# Patient Record
Sex: Female | Born: 1955 | Race: White | Hispanic: No | Marital: Married | Smoking: Former smoker
Health system: Southern US, Community
[De-identification: ages and names within clinical notes are randomized; demographics above are authoritative.]

## PROBLEM LIST (undated history)

## (undated) DIAGNOSIS — C801 Malignant (primary) neoplasm, unspecified: Secondary | ICD-10-CM

## (undated) DIAGNOSIS — I10 Essential (primary) hypertension: Secondary | ICD-10-CM

## (undated) HISTORY — PX: HIP SURGERY: SHX245

## (undated) HISTORY — DX: Malignant (primary) neoplasm, unspecified: C80.1

## (undated) HISTORY — DX: Essential (primary) hypertension: I10

## (undated) HISTORY — PX: TONSILLECTOMY: SUR1361

---

## 2016-05-21 DIAGNOSIS — M810 Age-related osteoporosis without current pathological fracture: Secondary | ICD-10-CM | POA: Diagnosis present

## 2018-08-15 ENCOUNTER — Ambulatory Visit (INDEPENDENT_AMBULATORY_CARE_PROVIDER_SITE_OTHER): Payer: 59 | Admitting: Family Medicine

## 2018-08-15 ENCOUNTER — Encounter: Payer: Self-pay | Admitting: Family Medicine

## 2018-08-15 VITALS — BP 118/68 | HR 88 | Temp 98.4°F | Ht 68.0 in | Wt 143.0 lb

## 2018-08-15 DIAGNOSIS — M810 Age-related osteoporosis without current pathological fracture: Secondary | ICD-10-CM | POA: Diagnosis not present

## 2018-08-15 DIAGNOSIS — Z7689 Persons encountering health services in other specified circumstances: Secondary | ICD-10-CM | POA: Diagnosis not present

## 2018-08-15 DIAGNOSIS — J302 Other seasonal allergic rhinitis: Secondary | ICD-10-CM | POA: Diagnosis not present

## 2018-08-15 DIAGNOSIS — I10 Essential (primary) hypertension: Secondary | ICD-10-CM

## 2018-08-15 MED ORDER — LISINOPRIL-HYDROCHLOROTHIAZIDE 10-12.5 MG PO TABS: 1.0000 | ORAL_TABLET | Freq: Every day | ORAL | 3 refills | Status: DC

## 2018-08-15 NOTE — Patient Instructions (Signed)
Allergies, Adult An allergy is when your body's defense system (immune system) overreacts to an otherwise harmless substance (allergen) that you breathe in or eat or something that touches your skin. When you come into contact with something that you are allergic to, your immune system produces certain proteins (antibodies). These proteins cause cells to release chemicals (histamines) that trigger the symptoms of an allergic reaction. Allergies often affect the nasal passages (allergic rhinitis), eyes (allergic conjunctivitis), skin (atopic dermatitis), and stomach. Allergies can be mild or severe. Allergies cannot spread from person to person (are not contagious). They can develop at any age and may be outgrown. What increases the risk? You may be at greater risk of allergies if other people in your family have allergies. What are the signs or symptoms? Symptoms depend on what type of allergy you have. They may include:  Runny, stuffy nose.  Sneezing.  Itchy mouth, ears, or throat.  Postnasal drip.  Sore throat.  Itchy, red, watery, or puffy eyes.  Skin rash or hives.  Stomach pain.  Vomiting.  Diarrhea.  Bloating.  Wheezing or coughing.  People with a severe allergy to food, medicine, or an insect bite may have a life-threatening allergic reaction (anaphylaxis). Symptoms of anaphylaxis include:  Hives.  Itching.  Flushed face.  Swollen lips, tongue, or mouth.  Tight or swollen throat.  Chest pain or tightness in the chest.  Trouble breathing or shortness of breath.  Rapid heartbeat.  Dizziness or fainting.  Vomiting.  Diarrhea.  Pain in the abdomen.  How is this diagnosed? This condition is diagnosed based on:  Your symptoms.  Your family and medical history.  A physical exam.  You may need to see a health care provider who specializes in treating allergies (allergist). You may also have tests, including:  Skin tests to see which allergens are  causing your symptoms, such as: ? Skin prick test. In this test, your skin is pricked with a tiny needle and exposed to small amounts of possible allergens to see if your skin reacts. ? Intradermal skin test. In this test, a small amount of allergen is injected under your skin to see if your skin reacts. ? Patch test. In this test, a small amount of allergen is placed on your skin and then your skin is covered with a bandage. Your health care provider will check your skin after a couple of days to see if a rash has developed.  Blood tests.  Challenges tests. In this test, you inhale a small amount of allergen by mouth to see if you have an allergic reaction.  You may also be asked to:  Keep a food diary. A food diary is a record of all the foods and drinks you have in a day and any symptoms you experience.  Practice an elimination diet. An elimination diet involves eliminating specific foods from your diet and then adding them back in one by one to find out if a certain food causes an allergic reaction.  How is this treated? Treatment for allergies depends on your symptoms. Treatment may include:  Cold compresses to soothe itching and swelling.  Eye drops.  Nasal sprays.  Using a saline spray or container (neti pot) to flush out the nose (nasal irrigation). These methods can help clear away mucus and keep the nasal passages moist.  Using a humidifier.  Oral antihistamines or other medicines to block allergic reaction and inflammation.  Skin creams to treat rashes or itching.  Diet changes to   eliminate food allergy triggers.  Repeated exposure to tiny amounts of allergens to build up a tolerance and prevent future allergic reactions (immunotherapy). These include: ? Allergy shots. ? Oral treatment. This involves taking small doses of an allergen under the tongue (sublingual immunotherapy).  Emergency epinephrine injection (auto-injector) in case of an allergic emergency. This is  a self-injectable, pre-measured medicine that must be given within the first few minutes of a serious allergic reaction.  Follow these instructions at home:  Avoid known allergens whenever possible.  If you suffer from airborne allergens, wash out your nose daily. You can do this with a saline spray or a neti pot to flush out your nose (nasal irrigation).  Take over-the-counter and prescription medicines only as told by your health care provider.  Keep all follow-up visits as told by your health care provider. This is important.  If you are at risk of a severe allergic reaction (anaphylaxis), keep your auto-injector with you at all times.  If you have ever had anaphylaxis, wear a medical alert bracelet or necklace that states you have a severe allergy. Contact a health care provider if:  Your symptoms do not improve with treatment. Get help right away if:  You have symptoms of anaphylaxis, such as: ? Swollen mouth, tongue, or throat. ? Pain or tightness in your chest. ? Trouble breathing or shortness of breath. ? Dizziness or fainting. ? Severe abdominal pain, vomiting, or diarrhea. This information is not intended to replace advice given to you by your health care provider. Make sure you discuss any questions you have with your health care provider. Document Released: 02/12/2003 Document Revised: 03/20/2017 Document Reviewed: 06/06/2016 Elsevier Interactive Patient Education  2018 [employer].  Managing Your Hypertension Hypertension is commonly called high blood pressure. This is when the force of your blood pressing against the walls of your arteries is too strong. Arteries are blood vessels that carry blood from your heart throughout your body. Hypertension forces the heart to work harder to pump blood, and may cause the arteries to become narrow or stiff. Having untreated or uncontrolled hypertension can cause heart attack, stroke, kidney disease, and other problems. What are  blood pressure readings? A blood pressure reading consists of a higher number over a lower number. Ideally, your blood pressure should be below 120/80. The first ("top") number is called the systolic pressure. It is a measure of the pressure in your arteries as your heart beats. The second ("bottom") number is called the diastolic pressure. It is a measure of the pressure in your arteries as the heart relaxes. What does my blood pressure reading mean? Blood pressure is classified into four stages. Based on your blood pressure reading, your health care provider may use the following stages to determine what type of treatment you need, if any. Systolic pressure and diastolic pressure are measured in a unit called mm Hg. Normal  Systolic pressure: below 737.  Diastolic pressure: below 80. Elevated  Systolic pressure: 106-269.  Diastolic pressure: below 80. Hypertension stage 1  Systolic pressure: 485-462.  Diastolic pressure: 70-35. Hypertension stage 2  Systolic pressure: 009 or above.  Diastolic pressure: 90 or above. What health risks are associated with hypertension? Managing your hypertension is an important responsibility. Uncontrolled hypertension can lead to:  A heart attack.  A stroke.  A weakened blood vessel (aneurysm).  Heart failure.  Kidney damage.  Eye damage.  Metabolic syndrome.  Memory and concentration problems.  What changes can I make to manage my  hypertension? Hypertension can be managed by making lifestyle changes and possibly by taking medicines. Your health care provider will help you make a plan to bring your blood pressure within a normal range. Eating and drinking  Eat a diet that is high in fiber and potassium, and low in salt (sodium), added sugar, and fat. An example eating plan is called the DASH (Dietary Approaches to Stop Hypertension) diet. To eat this way: ? Eat plenty of fresh fruits and vegetables. Try to fill half of your plate at  each meal with fruits and vegetables. ? Eat whole grains, such as whole wheat pasta, brown rice, or whole grain bread. Fill about one quarter of your plate with whole grains. ? Eat low-fat diary products. ? Avoid fatty cuts of meat, processed or cured meats, and poultry with skin. Fill about one quarter of your plate with lean proteins such as fish, chicken without skin, beans, eggs, and tofu. ? Avoid premade and processed foods. These tend to be higher in sodium, added sugar, and fat.  Reduce your daily sodium intake. Most people with hypertension should eat less than 1,500 mg of sodium a day.  Limit alcohol intake to no more than 1 drink a day for nonpregnant women and 2 drinks a day for men. One drink equals 12 oz of beer, 5 oz of wine, or 1 oz of hard liquor. Lifestyle  Work with your health care provider to maintain a healthy body weight, or to lose weight. Ask what an ideal weight is for you.  Get at least 30 minutes of exercise that causes your heart to beat faster (aerobic exercise) most days of the week. Activities may include walking, swimming, or biking.  Include exercise to strengthen your muscles (resistance exercise), such as weight lifting, as part of your weekly exercise routine. Try to do these types of exercises for 30 minutes at least 3 days a week.  Do not use any products that contain nicotine or tobacco, such as cigarettes and e-cigarettes. If you need help quitting, ask your health care provider.  Control any long-term (chronic) conditions you have, such as high cholesterol or diabetes. Monitoring  Monitor your blood pressure at home as told by your health care provider. Your personal target blood pressure may vary depending on your medical conditions, your age, and other factors.  Have your blood pressure checked regularly, as often as told by your health care provider. Working with your health care provider  Review all the medicines you take with your health care  provider because there may be side effects or interactions.  Talk with your health care provider about your diet, exercise habits, and other lifestyle factors that may be contributing to hypertension.  Visit your health care provider regularly. Your health care provider can help you create and adjust your plan for managing hypertension. Will I need medicine to control my blood pressure? Your health care provider may prescribe medicine if lifestyle changes are not enough to get your blood pressure under control, and if:  Your systolic blood pressure is 130 or higher.  Your diastolic blood pressure is 80 or higher.  Take medicines only as told by your health care provider. Follow the directions carefully. Blood pressure medicines must be taken as prescribed. The medicine does not work as well when you skip doses. Skipping doses also puts you at risk for problems. Contact a health care provider if:  You think you are having a reaction to medicines you have taken.  You  have repeated (recurrent) headaches.  You feel dizzy.  You have swelling in your ankles.  You have trouble with your vision. Get help right away if:  You develop a severe headache or confusion.  You have unusual weakness or numbness, or you feel faint.  You have severe pain in your chest or abdomen.  You vomit repeatedly.  You have trouble breathing. Summary  Hypertension is when the force of blood pumping through your arteries is too strong. If this condition is not controlled, it may put you at risk for serious complications.  Your personal target blood pressure may vary depending on your medical conditions, your age, and other factors. For most people, a normal blood pressure is less than 120/80.  Hypertension is managed by lifestyle changes, medicines, or both. Lifestyle changes include weight loss, eating a healthy, low-sodium diet, exercising more, and limiting alcohol. This information is not intended to  replace advice given to you by your health care provider. Make sure you discuss any questions you have with your health care provider. Document Released: 08/13/2012 Document Revised: 10/17/2016 Document Reviewed: 10/17/2016 Elsevier Interactive Patient Education  2018 [employer].  Osteoporosis Osteoporosis is the thinning and loss of density in the bones. Osteoporosis makes the bones more brittle, fragile, and likely to break (fracture). Over time, osteoporosis can cause the bones to become so weak that they fracture after a simple fall. The bones most likely to fracture are the bones in the hip, wrist, and spine. What are the causes? The exact cause is not known. What increases the risk? Anyone can develop osteoporosis. You may be at greater risk if you have a family history of the condition or have poor nutrition. You may also have a higher risk if you are:  Female.  33 years old or older.  A smoker.  Not physically active.  White or Asian.  Slender.  What are the signs or symptoms? A fracture might be the first sign of the disease, especially if it results from a fall or injury that would not usually cause a bone to break. Other signs and symptoms include:  Low back and neck pain.  Stooped posture.  Height loss.  How is this diagnosed? To make a diagnosis, your health care provider may:  Take a medical history.  Perform a physical exam.  Order tests, such as: ? A bone mineral density test. ? A dual-energy X-ray absorptiometry test.  How is this treated? The goal of osteoporosis treatment is to strengthen your bones to reduce your risk of a fracture. Treatment may involve:  Making lifestyle changes, such as: ? Eating a diet rich in calcium. ? Doing weight-bearing and muscle-strengthening exercises. ? Stopping tobacco use. ? Limiting alcohol intake.  Taking medicine to slow the process of bone loss or to increase bone density.  Monitoring your levels of  calcium and vitamin D.  Follow these instructions at home:  Include calcium and vitamin D in your diet. Calcium is important for bone health, and vitamin D helps the body absorb calcium.  Perform weight-bearing and muscle-strengthening exercises as directed by your health care provider.  Do not use any tobacco products, including cigarettes, chewing tobacco, and electronic cigarettes. If you need help quitting, ask your health care provider.  Limit your alcohol intake.  Take medicines only as directed by your health care provider.  Keep all follow-up visits as directed by your health care provider. This is important.  Take precautions at home to lower your risk of  falling, such as: ? Keeping rooms well lit and clutter free. ? Installing safety rails on stairs. ? Using rubber mats in the bathroom and other areas that are often wet or slippery. Get help right away if: You fall or injure yourself. This information is not intended to replace advice given to you by your health care provider. Make sure you discuss any questions you have with your health care provider. Document Released: 08/29/2005 Document Revised: 04/23/2016 Document Reviewed: 04/29/2014 Elsevier Interactive Patient Education  [employer].

## 2018-08-15 NOTE — Progress Notes (Signed)
Patient presents to clinic today to establish care.  SUBJECTIVE: PMH: Pt is a 62 yo female with pmh sig for osteoporosis, HTN, h/o basal cell carcinoma.  Pt previously seen at [facility] in [place], [place].  HTN: -taking lisinopril-HCTZ 10-12.5 mg daily -checks bp at home.  Has not done so since moving as bp cuff still packed. -denies HAs, CP, SOB, blurred vision  H/o basal cell carcinoma: -area on L side of nose, removed -was seeing Derm for yrly skin checks.  Osteoporosis: -thought 2/2 early menopause -was on Fosamax x 5 years.  Not sure made a difference -Pt walks 3 times per week gentleman occasionally. -Taking calcium and vitamin D supplement daily.  Also taking MVI -Last bone scan 2017  Allergies: NKDA  Past surgical history: Tonsillectomy 1963  Social history: Patient is married.  She and her husband (also seen by this provider) moved from the [facility] area after being laid off from their jobs.  Pt is now retired.  Pt has a 68 year old daughter who is a Equities trader at [employer].  Pt used an egg donor to become pregnant.  Pt endorses social alcohol use.  Pt had a brief smoking history starting in late teens, smoked x7 years, 1 pack/day.  Pt denies drug use  Family medical history: Mom-alive, Paget's disease of the nipple status post mastectomy Dad-deceased MI at age 69, heart disease Sister-Allison  Health Maintenance: Immunizations --shingles vaccine 2017, tetanus 2015, influenza 2018 Colonoscopy --2017 Mammogram --2017 PAP --2017 Bone Density --2017   Past Medical History:  Diagnosis Date  . Cancer ([place])   . Hypertension    Current Outpatient Medications on File Prior to Visit  Medication Sig Dispense Refill  . Calcium Carb-Cholecalciferol (CALCIUM 600-D PO) Take 600 mg by mouth daily.    . Multiple Vitamin (MULTIVITAMINS PO) Take by mouth.     No current facility-administered medications on file prior to visit.     No Known Allergies  No family history  on file.  Social History   Socioeconomic History  . Marital status: Married    Spouse name: Not on file  . Number of children: Not on file  . Years of education: Not on file  . Highest education level: Not on file  Occupational History  . Not on file  Social Needs  . Financial resource strain: Not on file  . Food insecurity:    Worry: Not on file    Inability: Not on file  . Transportation needs:    Medical: Not on file    Non-medical: Not on file  Tobacco Use  . Smoking status: Former Research scientist (life sciences)  . Smokeless tobacco: Never Used  Substance and Sexual Activity  . Alcohol use: Yes  . Drug use: Never  . Sexual activity: Not on file  Lifestyle  . Physical activity:    Days per week: Not on file    Minutes per session: Not on file  . Stress: Not on file  Relationships  . Social connections:    Talks on phone: Not on file    Gets together: Not on file    Attends religious service: Not on file    Active member of club or organization: Not on file    Attends meetings of clubs or organizations: Not on file    Relationship status: Not on file  . Intimate partner violence:    Fear of current or ex partner: Not on file    Emotionally abused: Not on file  Physically abused: Not on file    Forced sexual activity: Not on file  Other Topics Concern  . Not on file  Social History Narrative  . Not on file    ROS General: Denies fever, chills, night sweats, changes in weight, changes in appetite HEENT: Denies headaches, ear pain, changes in vision, rhinorrhea, sore throat CV: Denies CP, palpitations, SOB, orthopnea Pulm: Denies SOB, cough, wheezing GI: Denies abdominal pain, nausea, vomiting, diarrhea, constipation GU: Denies dysuria, hematuria, frequency, vaginal discharge Msk: Denies muscle cramps, joint pains Neuro: Denies weakness, numbness, tingling Skin: Denies rashes, bruising Psych: Denies depression, anxiety, hallucinations  BP 118/68 (BP Location: Left Arm, Patient  Position: Sitting, Cuff Size: Normal)   Pulse 88   Temp 98.4 F (36.9 C) (Oral)   Ht 5\' 8"  (1.727 m)   Wt 143 lb (64.9 kg)   SpO2 97%   BMI 21.74 kg/m   Physical Exam Gen. Pleasant, well developed, well-nourished, in NAD HEENT - [place]/AT, PERRL, no scleral icterus, no nasal drainage, pharynx without erythema or exudate.  TMs normal bilaterally.  No cervical lymphadenopathy. Lungs: no use of accessory muscles, CTAB, no wheezes, rales or rhonchi Cardiovascular: RRR, No r/g/m, no peripheral edema Abdomen: BS present, soft, nontender, nondistended Neuro:  A&Ox3, CN II-XII intact, normal gait Skin:  Warm, dry, intact, no lesions  No results found for this or any previous visit (from the past 2160 hour(s)).  Assessment/Plan: Essential hypertension  -controlled -Continue lifestyle modifications -Given handout - Plan: lisinopril-hydrochlorothiazide (PRINZIDE,ZESTORETIC) 10-12.5 MG tablet  Seasonal allergies -Given handout -OTC Claritin on Zyrtec as needed.  Also consider nasal rinse  Age-related osteoporosis without current pathological fracture -Continue vitamin D and calcium supplements -Continue weightbearing exercise -Consider repeat bone scan  Encounter to establish care -We reviewed the PMH, PSH, FH, SH, Meds and Allergies. -We provided refills for any medications we will prescribe as needed. -We addressed current concerns per orders and patient instructions. -We have asked for records for pertinent exams, studies, vaccines and notes from previous providers. -We have advised patient to follow up per instructions below.  Follow-up in the next few months, sooner if needed  Grier Mitts, MD

## 2018-08-20 ENCOUNTER — Encounter: Payer: Self-pay | Admitting: Family Medicine

## 2018-09-18 ENCOUNTER — Ambulatory Visit (INDEPENDENT_AMBULATORY_CARE_PROVIDER_SITE_OTHER): Payer: BLUE CROSS/BLUE SHIELD | Admitting: *Deleted

## 2018-09-18 DIAGNOSIS — Z23 Encounter for immunization: Secondary | ICD-10-CM

## 2019-06-29 DIAGNOSIS — M545 Low back pain: Secondary | ICD-10-CM | POA: Diagnosis not present

## 2019-06-29 DIAGNOSIS — M25552 Pain in left hip: Secondary | ICD-10-CM | POA: Diagnosis not present

## 2019-07-08 DIAGNOSIS — M545 Low back pain: Secondary | ICD-10-CM | POA: Diagnosis not present

## 2019-07-08 DIAGNOSIS — M25552 Pain in left hip: Secondary | ICD-10-CM | POA: Diagnosis not present

## 2019-07-22 DIAGNOSIS — M545 Low back pain: Secondary | ICD-10-CM | POA: Diagnosis not present

## 2019-07-22 DIAGNOSIS — M25552 Pain in left hip: Secondary | ICD-10-CM | POA: Diagnosis not present

## 2019-08-05 ENCOUNTER — Other Ambulatory Visit: Payer: Self-pay | Admitting: Family Medicine

## 2019-08-05 ENCOUNTER — Telehealth: Payer: Self-pay

## 2019-08-05 DIAGNOSIS — M25552 Pain in left hip: Secondary | ICD-10-CM | POA: Diagnosis not present

## 2019-08-05 DIAGNOSIS — M545 Low back pain: Secondary | ICD-10-CM | POA: Diagnosis not present

## 2019-08-05 DIAGNOSIS — I10 Essential (primary) hypertension: Secondary | ICD-10-CM

## 2019-08-05 MED ORDER — LISINOPRIL-HYDROCHLOROTHIAZIDE 10-12.5 MG PO TABS: 1.0000 | ORAL_TABLET | Freq: Every day | ORAL | 0 refills | Status: DC

## 2019-08-05 NOTE — Telephone Encounter (Signed)
Pt needs appointment for further refills 

## 2019-08-05 NOTE — Telephone Encounter (Signed)
Requested medication (s) are due for refill today: yes  Requested medication (s) are on the active medication list: yes  Last refill:  08/2018  Future visit scheduled: no  Notes to clinic: review for refill  Requested Prescriptions  Pending Prescriptions Disp Refills   lisinopril-hydrochlorothiazide (ZESTORETIC) 10-12.5 MG tablet 90 tablet 3    Sig: Take 1 tablet by mouth daily.     Cardiovascular:  ACEI + Diuretic Combos Failed - 08/05/2019  1:11 PM      Failed - Na in normal range and within 180 days    No results found for: NA       Failed - K in normal range and within 180 days    No results found for: K, POTASSIUM       Failed - Cr in normal range and within 180 days    No results found for: CREATININE       Failed - Ca in normal range and within 180 days    No results found for: CALCIUM, CORRECTEDCA, CAWHOLEBLD, POCCA       Failed - Valid encounter within last 6 months    Recent Outpatient Visits          11 months ago Essential hypertension   Therapist, music at [place], [place]             Passed - Patient is not pregnant      Passed - Last BP in normal range    BP Readings from Last 1 Encounters:  08/15/18 118/68

## 2019-08-05 NOTE — Telephone Encounter (Signed)
Medication Refill - Medication: lisinopril-hydrochlorothiazide (PRINZIDE,ZESTORETIC) 10-12.5 MG tablet    Preferred Pharmacy (with phone number or street name):  [id number] - [place], [place]. AT [address] [phone] (Phone) [phone] (Fax)

## 2019-08-05 NOTE — Telephone Encounter (Signed)
Copied from [place] [id number]. Topic: General - Other >> Aug 05, 2019  1:10 PM Keene Breath wrote: Reason for CRM: Patient called to ask about the shingrix vaccine.  Would like for the nurse to call her back to discuss.  CB# [phone]

## 2019-08-07 NOTE — Telephone Encounter (Signed)
LVM for pt with information regarding shingrix vaccine

## 2019-08-12 ENCOUNTER — Telehealth: Payer: Self-pay

## 2019-08-12 NOTE — Telephone Encounter (Signed)
Copied from [place] [id number]. Topic: General - Other >> Aug 05, 2019  1:10 PM Keene Breath wrote: Reason for CRM: Patient called to ask about the shingrix vaccine.  Would like for the nurse to call her back to discuss.  CB# [phone] >> Aug 12, 2019 12:24 PM Rainey Pines A wrote: Patient called back in regards to getting approval for shingrix  shot and would like a callback

## 2019-08-17 NOTE — Telephone Encounter (Signed)
Pt is scheduled for a shingle vaccine on 08/25/2019 at 2.15 pm, pt state that pharmacy did not receive refills on her Lisinopril-hctz, verbal order given for 90 days with 3 refills

## 2019-08-25 ENCOUNTER — Other Ambulatory Visit: Payer: Self-pay

## 2019-08-25 ENCOUNTER — Ambulatory Visit (INDEPENDENT_AMBULATORY_CARE_PROVIDER_SITE_OTHER): Payer: BC Managed Care – PPO

## 2019-08-25 DIAGNOSIS — Z23 Encounter for immunization: Secondary | ICD-10-CM | POA: Diagnosis not present

## 2019-09-09 ENCOUNTER — Ambulatory Visit (INDEPENDENT_AMBULATORY_CARE_PROVIDER_SITE_OTHER): Payer: BC Managed Care – PPO

## 2019-09-09 ENCOUNTER — Other Ambulatory Visit: Payer: Self-pay

## 2019-09-09 DIAGNOSIS — Z23 Encounter for immunization: Secondary | ICD-10-CM

## 2019-09-09 NOTE — Progress Notes (Signed)
Per orders of Dr. Banks, injection of Shingrix given by Landi Biscardi. Patient tolerated injection well.  

## 2019-11-16 ENCOUNTER — Encounter: Payer: Self-pay | Admitting: Family Medicine

## 2019-11-18 ENCOUNTER — Telehealth (INDEPENDENT_AMBULATORY_CARE_PROVIDER_SITE_OTHER): Payer: Self-pay | Admitting: Family Medicine

## 2019-11-18 DIAGNOSIS — M5382 Other specified dorsopathies, cervical region: Secondary | ICD-10-CM

## 2019-11-18 MED ORDER — CYCLOBENZAPRINE HCL 5 MG PO TABS: 5.0000 mg | ORAL_TABLET | Freq: Three times a day (TID) | ORAL | 1 refills | Status: DC | PRN

## 2019-11-18 NOTE — Progress Notes (Signed)
Virtual Visit via Telephone Note Pt called on phone after connectivity issues with doxy.  I connected with Roberts Gaudy on 11/18/19 at  4:00 PM EST by telephone and verified that I am speaking with the correct person using two identifiers.   I discussed the limitations, risks, security and privacy concerns of performing an evaluation and management service by telephone and the availability of in person appointments. I also discussed with the patient that there may be a patient responsible charge related to this service. The patient expressed understanding and agreed to proceed.  Location patient: home Location provider: work or home office Participants present for the call: patient, provider Patient did not have a visit in the prior 7 days to address this/these issue(s).   History of Present Illness: Pt is a 63 yo female with pmh sig for HTN, h/o basal cell carcinoma s/p removal, osteoporosis.  Pt with pain in L posterior head/neck at mastoid process off and on x several wks.  Pt did not have symptoms while on 5 d vacation in [place].  Upon return was symptom free x 3 days before symptoms returned.  Aleeve, tylenol, advil do not help.  Had vision checked, notes rx changed, awaiting new glasses.  Pt got a new bed in Oct.  Pt denies increased stress.  Pt retired from [employer].  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  Reads the news on her ipad in am.  Drinks 2 cups of coffee per day.  Denies grinding her teeth.  Pt also tried sleeping in a different bed, using a different pillow with no resolve.  Notes had shingles vaccine in Oct.   Observations/Objective: Patient sounds cheerful and well on the phone. I do not appreciate any SOB. Speech and thought processing are grossly intact. Patient reported vitals:  Assessment and Plan:  Musculoskeletal disorder involving sternocleidomastoid  -symptoms likely 2/2 muscle tension in L sternocleidomastoid at mastoid process. Also consider  stress as symptoms resolved while on vacation. -discussed supportive care including heat, massage, OTC topical analgesic rubs, stretching -reviewed exercises/stretching - Plan: cyclobenzaprine (FLEXERIL) 5 MG tablet   Follow Up Instructions: F/u in 2-4 wks for continued symptoms.  I did not refer this patient for an OV in the next 24 hours for this/these issue(s).  I discussed the assessment and treatment plan with the patient. The patient was provided an opportunity to ask questions and all were answered. The patient agreed with the plan and demonstrated an understanding of the instructions.   The patient was advised to call back or seek an in-person evaluation if the symptoms worsen or if the condition fails to improve as anticipated.  I provided 13 minutes of non-face-to-face time during this encounter.   Billie Ruddy, MD   This note is not being shared with the patient for the following reason: To prevent harm (release of this note would result in harm to the life or physical safety of the patient or another).

## 2020-02-11 ENCOUNTER — Other Ambulatory Visit: Payer: Self-pay

## 2020-02-12 ENCOUNTER — Ambulatory Visit (INDEPENDENT_AMBULATORY_CARE_PROVIDER_SITE_OTHER): Payer: BC Managed Care – PPO

## 2020-02-12 DIAGNOSIS — Z23 Encounter for immunization: Secondary | ICD-10-CM

## 2020-02-12 NOTE — Progress Notes (Signed)
Per orders of Dr. Grier Mitts, 2nd Shingles vaccine given by Franco Collet. Patient tolerated injection well.

## 2020-02-12 NOTE — Patient Instructions (Signed)
Health Maintenance Due  Topic Date Due  . Hepatitis C Screening  Never done  . HIV Screening  Never done  . PAP SMEAR-Modifier  Never done  . MAMMOGRAM  Never done    No flowsheet data found.

## 2020-04-19 DIAGNOSIS — M545 Low back pain: Secondary | ICD-10-CM | POA: Diagnosis not present

## 2020-04-19 DIAGNOSIS — M25552 Pain in left hip: Secondary | ICD-10-CM | POA: Diagnosis not present

## 2020-04-27 DIAGNOSIS — M545 Low back pain: Secondary | ICD-10-CM | POA: Diagnosis not present

## 2020-04-27 DIAGNOSIS — M25552 Pain in left hip: Secondary | ICD-10-CM | POA: Diagnosis not present

## 2020-05-24 DIAGNOSIS — M25552 Pain in left hip: Secondary | ICD-10-CM | POA: Diagnosis not present

## 2020-05-24 DIAGNOSIS — M545 Low back pain: Secondary | ICD-10-CM | POA: Diagnosis not present

## 2020-07-08 DIAGNOSIS — M545 Low back pain: Secondary | ICD-10-CM | POA: Diagnosis not present

## 2020-07-08 DIAGNOSIS — M25552 Pain in left hip: Secondary | ICD-10-CM | POA: Diagnosis not present

## 2020-07-26 DIAGNOSIS — M25552 Pain in left hip: Secondary | ICD-10-CM | POA: Diagnosis not present

## 2020-07-26 DIAGNOSIS — M545 Low back pain: Secondary | ICD-10-CM | POA: Diagnosis not present

## 2020-08-15 ENCOUNTER — Other Ambulatory Visit: Payer: Self-pay | Admitting: Family Medicine

## 2020-08-15 DIAGNOSIS — I10 Essential (primary) hypertension: Secondary | ICD-10-CM

## 2020-08-18 ENCOUNTER — Encounter: Payer: Self-pay | Admitting: Family Medicine

## 2020-11-09 ENCOUNTER — Other Ambulatory Visit: Payer: Self-pay | Admitting: Family Medicine

## 2020-11-09 DIAGNOSIS — I10 Essential (primary) hypertension: Secondary | ICD-10-CM

## 2020-11-09 NOTE — Telephone Encounter (Signed)
Pt needs appointment for further refill 

## 2020-12-22 ENCOUNTER — Ambulatory Visit (INDEPENDENT_AMBULATORY_CARE_PROVIDER_SITE_OTHER): Payer: Medicare Other

## 2020-12-22 ENCOUNTER — Ambulatory Visit (INDEPENDENT_AMBULATORY_CARE_PROVIDER_SITE_OTHER): Payer: Medicare Other | Admitting: Family Medicine

## 2020-12-22 ENCOUNTER — Other Ambulatory Visit: Payer: Self-pay

## 2020-12-22 ENCOUNTER — Encounter: Payer: Self-pay | Admitting: Family Medicine

## 2020-12-22 VITALS — BP 142/86 | HR 101 | Ht 68.0 in | Wt 153.0 lb

## 2020-12-22 DIAGNOSIS — M5442 Lumbago with sciatica, left side: Secondary | ICD-10-CM

## 2020-12-22 DIAGNOSIS — G8929 Other chronic pain: Secondary | ICD-10-CM

## 2020-12-22 NOTE — Patient Instructions (Signed)
Xray today Total of 2000IU of Vit D Daily Tart cherry 1200mg  at night Exercises 3x a week Ice 66min 2x a day See me in 6 weeks can discuss gabapentin

## 2020-12-22 NOTE — Progress Notes (Signed)
[facility] [place] Phone: [phone] Subjective:   Sherry Harrison, am serving as a scribe for Dr. Hulan Saas. This visit occurred during the SARS-CoV-2 public health emergency.  Safety protocols were in place, including screening questions prior to the visit, additional usage of staff PPE, and extensive cleaning of exam room while observing appropriate contact time as indicated for disinfecting solutions.   I'm seeing this patient by the request  of:  Billie Ruddy, MD  CC: Low back and hip pain  [id number]  Sherry Harrison is a 65 y.o. female coming in with complaint of back pain for years. Has had a lot of physical therapy. Pain radiates from back to the left hip. Had MRI of left hip in [place]. Was told all pain was soft tissue related. Has been doing physical therapy at [facility] and has occasional dry needling therapy. Pain starts in left lumbar spine that radiates down into the glute that then travels into the groin. When pain is bad her pain will also radiate into the foot. Patient mentions having trouble going up stairs. Has not tried chiropractic care.  Patient states that has started to increase little activity and has been slowly getting better again.  Patient though is concerned that this can come back and worsen again. Past medical history also significant for osteoporosis    Past Medical History:  Diagnosis Date  . Cancer ([place])   . Hypertension    Past Surgical History:  Procedure Laterality Date  . TONSILLECTOMY     Social History   Socioeconomic History  . Marital status: Married    Spouse name: Not on file  . Number of children: Not on file  . Years of education: Not on file  . Highest education level: Not on file  Occupational History  . Not on file  Tobacco Use  . Smoking status: Former Research scientist (life sciences)  . Smokeless tobacco: Never Used  Substance and Sexual Activity  . Alcohol use: Yes  . Drug  use: Never  . Sexual activity: Not on file  Other Topics Concern  . Not on file  Social History Narrative  . Not on file   Social Determinants of Health   Financial Resource Strain: Not on file  Food Insecurity: Not on file  Transportation Needs: Not on file  Physical Activity: Not on file  Stress: Not on file  Social Connections: Not on file   Harrison Known Allergies Harrison family history on file.   Current Outpatient Medications (Cardiovascular):  .  lisinopril-hydrochlorothiazide (ZESTORETIC) 10-12.5 MG tablet, TAKE 1 TABLET DAILY     Current Outpatient Medications (Other):  Marland Kitchen  Calcium Carb-Cholecalciferol (CALCIUM 600-D PO), Take 600 mg by mouth daily. .  cyclobenzaprine (FLEXERIL) 5 MG tablet, Take 1 tablet (5 mg total) by mouth 3 (three) times daily as needed for muscle spasms. .  Multiple Vitamin (MULTIVITAMINS PO), Take by mouth.   Reviewed prior external information including notes and imaging from  primary care provider As well as notes that were available from care everywhere and other healthcare systems.  Past medical history, social, surgical and family history all reviewed in electronic medical record.  Harrison pertanent information unless stated regarding to the chief complaint.   Review of Systems:  Harrison headache, visual changes, nausea, vomiting, diarrhea, constipation, dizziness, abdominal pain, skin rash, fevers, chills, night sweats, weight loss, swollen lymph nodes, body aches, joint swelling, chest pain, shortness of breath, mood changes.  POSITIVE muscle aches  Objective  Blood pressure (!) 142/86, pulse (!) 101, height 5\' 8"  (1.727 m), weight 153 lb (69.4 kg), SpO2 97 %.   General: Harrison apparent distress alert and oriented x3 mood and affect normal, dressed appropriately.  HEENT: Pupils equal, extraocular movements intact  Respiratory: Patient's speak in full sentences and does not appear short of breath  Cardiovascular: Harrison lower extremity edema, non tender, Harrison  erythema  Gait normal with good balance and coordination.  MSK: Low back exam does have some loss of lordosis.  Some tenderness to palpation in the paraspinal musculature left greater than right.  Mild tightness on FABER test on the left.  Patient does have some limited internal range of motion of the left hip noted.  Straight leg test does have some tightness of the hamstring but Harrison true radicular symptoms noted.  Patient does have increasing discomfort with dorsiflexion.  5 out of 5 strength though of the lower extremities and deep tendon reflexes do appear to be intact  [id number]; 15 additional minutes spent for Therapeutic exercises as stated in above notes.  This included exercises focusing on stretching, strengthening, with significant focus on eccentric aspects.   Long term goals include an improvement in range of motion, strength, endurance as well as avoiding reinjury. Patient's frequency would include in 1-2 times a day, 3-5 times a week for a duration of 6-12 weeks.  Proper technique shown and discussed handout in great detail with ATC.  All questions were discussed and answered.      Impression and Recommendations:     The above documentation has been reviewed and is accurate and complete Lyndal Pulley, DO

## 2020-12-23 ENCOUNTER — Encounter: Payer: Self-pay | Admitting: Family Medicine

## 2020-12-23 DIAGNOSIS — M545 Low back pain, unspecified: Secondary | ICD-10-CM | POA: Insufficient documentation

## 2020-12-23 NOTE — Assessment & Plan Note (Signed)
We discussed with patient that I do feel that there is some lumbar radiculopathy.  Differential includes the possibility of piriformis syndrome.  We do not have the MRI from the outside facility but has been told that it was more muscular in nature.  Patient does have a past medical history significant for osteoporosis and will get x-rays to rule out any type of occult fracture.  I think it is highly unlikely.  Patient does have some stiffness noted though.  Patient has Flexeril at this time and we did discuss the potential for gabapentin which patient declined.  We may need to consider that in the future as well as formal physical therapy.  Patient will start with home exercises first and we discussed over-the-counter natural vitamins.  Patient will follow-up with me again in 5 to 6 weeks and discussed once again the possibility of gabapentin and formal physical therapy or even advanced imaging if any weakness is noted

## 2020-12-26 ENCOUNTER — Other Ambulatory Visit: Payer: Self-pay | Admitting: Family Medicine

## 2020-12-26 ENCOUNTER — Encounter: Payer: Self-pay | Admitting: Family Medicine

## 2020-12-26 DIAGNOSIS — Z1231 Encounter for screening mammogram for malignant neoplasm of breast: Secondary | ICD-10-CM

## 2020-12-28 ENCOUNTER — Ambulatory Visit
Admission: RE | Admit: 2020-12-28 | Discharge: 2020-12-28 | Disposition: A | Discharge status: Home / Self Care | Payer: Medicare Other | Source: Ambulatory Visit

## 2020-12-28 ENCOUNTER — Other Ambulatory Visit: Payer: Self-pay

## 2020-12-28 DIAGNOSIS — Z1231 Encounter for screening mammogram for malignant neoplasm of breast: Secondary | ICD-10-CM

## 2020-12-28 LAB — HM MAMMOGRAPHY

## 2021-01-04 ENCOUNTER — Other Ambulatory Visit: Payer: Self-pay

## 2021-01-05 ENCOUNTER — Encounter: Payer: Self-pay | Admitting: Family Medicine

## 2021-01-05 ENCOUNTER — Ambulatory Visit (INDEPENDENT_AMBULATORY_CARE_PROVIDER_SITE_OTHER): Payer: Medicare Other | Admitting: Family Medicine

## 2021-01-05 VITALS — BP 138/78 | HR 83 | Temp 98.2°F | Wt 153.4 lb

## 2021-01-05 DIAGNOSIS — K581 Irritable bowel syndrome with constipation: Secondary | ICD-10-CM

## 2021-01-05 DIAGNOSIS — R030 Elevated blood-pressure reading, without diagnosis of hypertension: Secondary | ICD-10-CM

## 2021-01-05 NOTE — Progress Notes (Signed)
Subjective:    Patient ID: Sherry Harrison, female    DOB: [birth year], 65 y.o.   MRN: [id number]  No chief complaint on file.   HPI Patient is a 65 yo female with pmh sig for chronic low back pain who was seen today for acute concern.  Patient endorses episodes of flatus, nausea, constipation, bloating.  HAs a sensation of food feeling stuck in middle of abdomen at diaphragm.  Cannot correlate specific food for symptoms.  Eating healthy overall (grains, vegetables, grilled fish, chicken, positive, potatoes, rice).  Drinks 2 cups of coffee and water throughout the day.  Constipation when traveling.  A recent lumbar x-ray but noted constipation.  Took MiraLAX and Colace for several days to help with constipation.  Denies blood in stools, emesis, HA, diarrhea, increased stress.  Patient notes history of whitecoat hypertension.  Past Medical History:  Diagnosis Date  . Cancer ([place])   . Hypertension     No Known Allergies  ROS General: Denies fever, chills, night sweats, changes in weight, changes in appetite HEENT: Denies headaches, ear pain, changes in vision, rhinorrhea, sore throat CV: Denies CP, palpitations, SOB, orthopnea Pulm: Denies SOB, cough, wheezing GI: Denies abdominal pain, nausea, vomiting, diarrhea  +constipation, bloating, flatus GU: Denies dysuria, hematuria, frequency, vaginal discharge Msk: Denies muscle cramps, joint pains Neuro: Denies weakness, numbness, tingling Skin: Denies rashes, bruising Psych: Denies depression, anxiety, hallucinations     Objective:    Blood pressure 138/78, pulse 83, temperature 98.2 F (36.8 C), temperature source Oral, weight 153 lb 6.4 oz (69.6 kg), SpO2 98 %.  Gen. Pleasant, well-nourished, in no distress, normal affect  HEENT: [place]/AT, face symmetric, conjunctiva clear, no scleral icterus, PERRLA, EOMI, nares patent without drainage Lungs: no accessory muscle use, CTAB Cardiovascular: RRR, no m/r/g, no peripheral edema Abdomen:  BS present, soft, NT/ND, no hepatosplenomegaly. Musculoskeletal: No deformities, no cyanosis or clubbing, normal tone Neuro:  A&Ox3, CN II-XII intact, normal gait Skin:  Warm, no lesions/ rash  Wt Readings from Last 3 Encounters:  12/22/20 153 lb (69.4 kg)  08/15/18 143 lb (64.9 kg)    No results found for: WBC, HGB, HCT, PLT, GLUCOSE, CHOL, TRIG, HDL, LDLDIRECT, LDLCALC, ALT, AST, NA, K, CL, CREATININE, BUN, CO2, TSH, PSA, INR, GLUF, HGBA1C, MICROALBUR  Assessment/Plan: Irritable bowel syndrome with constipation -discussed supportive care including low FODMAP diet -pt to keep a food diary  -consider probiotic -miralax daily prn -f/u in 4 wks -consider referral to GI  Elevated blood pressure reading without diagnosis of HTN -pt encouraged to obtain bp cuff to monitor at home and keep a log to bring without to next OFV -decrease sodium intake -increase po intake of water  F/u in 1 month  Grier Mitts, MD

## 2021-01-05 NOTE — Patient Instructions (Addendum)
Start keeping a food diary to bring with you to your next appointment.  You can use MiraLAX daily as needed to help with constipation. Irritable Bowel Syndrome, Adult  Irritable bowel syndrome (IBS) is a group of symptoms that affects the organs responsible for digestion (gastrointestinal or GI tract). IBS is not one specific disease. To regulate how the GI tract works, the body sends signals back and forth between the intestines and the brain. If you have IBS, there may be a problem with these signals. As a result, the GI tract does not function normally. The intestines may become more sensitive and overreact to certain things. This may be especially true when you eat certain foods or when you are under stress. There are four types of IBS. These may be determined based on the consistency of your stool (feces):  IBS with diarrhea.  IBS with constipation.  Mixed IBS.  Unsubtyped IBS. It is important to know which type of IBS you have. Certain treatments are more likely to be helpful for certain types of IBS. What are the causes? The exact cause of IBS is not known. What increases the risk? You may have a higher risk for IBS if you:  Are female.  Are younger than 91.  Have a family history of IBS.  Have a mental health condition, such as depression, anxiety, or post-traumatic stress disorder.  Have had a bacterial infection of your GI tract. What are the signs or symptoms? Symptoms of IBS vary from person to person. The main symptom is abdominal pain or discomfort. Other symptoms usually include one or more of the following:  Diarrhea, constipation, or both.  Abdominal swelling or bloating.  Feeling full after eating a small or regular-sized meal.  Frequent gas.  Mucus in the stool.  A feeling of having more stool left after a bowel movement. Symptoms tend to come and go. They may be triggered by stress, mental health conditions, or certain foods. How is this  diagnosed? This condition may be diagnosed based on a physical exam, your medical history, and your symptoms. You may have tests, such as:  Blood tests.  Stool test.  X-rays.  CT scan.  Colonoscopy. This is a procedure in which your GI tract is viewed with a long, thin, flexible tube. How is this treated? There is no cure for IBS, but treatment can help relieve symptoms. Treatment depends on the type of IBS you have, and may include:  Changes to your diet, such as: ? Avoiding foods that cause symptoms. ? Drinking more water. ? Following a low-FODMAP (fermentable oligosaccharides, disaccharides, monosaccharides, and polyols) diet for up to 6 weeks, or as told by your health care provider. FODMAPs are sugars that are hard for some people to digest. ? Eating more fiber. ? Eating medium-sized meals at the same times every day.  Medicines. These may include: ? Fiber supplements, if you have constipation. ? Medicine to control diarrhea (antidiarrheal medicines). ? Medicine to help control muscle tightening (spasms) in your GI tract (antispasmodic medicines). ? Medicines to help with mental health conditions, such as antidepressants or tranquilizers.  Talk therapy or counseling.  Working with a diet and nutrition specialist (dietitian) to help create a food plan that is right for you.  Managing your stress. Follow these instructions at home: Eating and drinking  Eat a healthy diet.  Eat medium-sized meals at about the same time every day. Do not eat large meals.  Gradually eat more fiber-rich foods. These include whole  grains, fruits, and vegetables. This may be especially helpful if you have IBS with constipation.  Eat a diet low in FODMAPs.  Drink enough fluid to keep your urine pale yellow.  Keep a journal of foods that seem to trigger symptoms.  Avoid foods and drinks that: ? Contain added sugar. ? Make your symptoms worse. Dairy products, caffeinated drinks, and  carbonated drinks can make symptoms worse for some people. General instructions  Take over-the-counter and prescription medicines and supplements only as told by your health care provider.  Get enough exercise. Do at least 150 minutes of moderate-intensity exercise each week.  Manage your stress. Getting enough sleep and exercise can help you manage stress.  Keep all follow-up visits as told by your health care provider and therapist. This is important. Alcohol Use  Do not drink alcohol if: ? Your health care provider tells you not to drink. ? You are pregnant, may be pregnant, or are planning to become pregnant.  If you drink alcohol, limit how much you have: ? 0-1 drink a day for women. ? 0-2 drinks a day for men.  Be aware of how much alcohol is in your drink. In the [place] one drink equals one typical bottle of beer (12 oz), one-half glass of wine (5 oz), or one shot of hard liquor (1 oz). Contact a health care provider if you have:  Constant pain.  Weight loss.  Difficulty or pain when swallowing.  Diarrhea that gets worse. Get help right away if you have:  Severe abdominal pain.  Fever.  Diarrhea with symptoms of dehydration, such as dizziness or dry mouth.  Bright red blood in your stool.  Stool that is black and tarry.  Abdominal swelling.  Vomiting that does not stop.  Blood in your vomit. Summary  Irritable bowel syndrome (IBS) is not one specific disease. It is a group of symptoms that affects digestion.  Your intestines may become more sensitive and overreact to certain things. This may be especially true when you eat certain foods or when you are under stress.  There is no cure for IBS, but treatment can help relieve symptoms. This information is not intended to replace advice given to you by your health care provider. Make sure you discuss any questions you have with your health care provider. Document Revised: 07/21/2020 Document Reviewed:  07/21/2020 Elsevier Patient Education  2021 [place] stands for fermentable oligosaccharides, disaccharides, monosaccharides, and polyols. These are sugars that are hard for some people to digest. A low-FODMAP eating plan may help some people who have irritable bowel syndrome (IBS) and certain other bowel (intestinal) diseases to manage their symptoms. This meal plan can be complicated to follow. Work with a diet and nutrition specialist (dietitian) to make a low-FODMAP eating plan that is right for you. A dietitian can help make sure that you get enough nutrition from this diet. What are tips for following this plan? Reading food labels  Check labels for hidden FODMAPs such as: ? High-fructose syrup. ? Honey. ? Agave. ? Natural fruit flavors. ? Onion or garlic powder.  Choose low-FODMAP foods that contain 3-4 grams of fiber per serving.  Check food labels for serving sizes. Eat only one serving at a time to make sure FODMAP levels stay low. Shopping  Shop with a list of foods that are recommended on this diet and make a meal plan. Meal planning  Follow a low-FODMAP eating plan for up to 6 weeks,  or as told by your health care provider or dietitian.  To follow the eating plan: 1. Eliminate high-FODMAP foods from your diet completely. Choose only low-FODMAP foods to eat. You will do this for 2-6 weeks. 2. Gradually reintroduce high-FODMAP foods into your diet one at a time. Most people should wait a few days before introducing the next new high-FODMAP food into their meal plan. Your dietitian can recommend how quickly you may reintroduce foods. 3. Keep a daily record of what and how much you eat and drink. Make note of any symptoms that you have after eating. 4. Review your daily record with a dietitian regularly to identify which foods you can eat and which foods you should avoid. General tips  Drink enough fluid each day to keep your urine pale  yellow.  Avoid processed foods. These often have added sugar and may be high in FODMAPs.  Avoid most dairy products, whole grains, and sweeteners.  Work with a dietitian to make sure you get enough fiber in your diet.  Avoid high FODMAP foods at meals to manage symptoms. Recommended foods Fruits Bananas, oranges, tangerines, lemons, limes, blueberries, raspberries, strawberries, grapes, cantaloupe, honeydew melon, kiwi, papaya, passion fruit, and pineapple. Limited amounts of dried cranberries, banana chips, and shredded coconut. Vegetables Eggplant, zucchini, cucumber, peppers, green beans, bean sprouts, lettuce, arugula, kale, Swiss chard, spinach, collard greens, bok choy, summer squash, potato, and tomato. Limited amounts of corn, carrot, and sweet potato. Green parts of scallions. Grains Gluten-free grains, such as rice, oats, buckwheat, quinoa, corn, polenta, and millet. Gluten-free pasta, bread, or cereal. Rice noodles. Corn tortillas. Meats and other proteins Unseasoned beef, pork, poultry, or fish. Eggs. Berniece Salines. Tofu (firm) and tempeh. Limited amounts of nuts and seeds, such as almonds, walnuts, [place] nuts, pecans, peanuts, nut butters, pumpkin seeds, chia seeds, and sunflower seeds. Dairy Lactose-free milk, yogurt, and kefir. Lactose-free cottage cheese and ice cream. Non-dairy milks, such as almond, coconut, hemp, and rice milk. Non-dairy yogurt. Limited amounts of goat cheese, brie, mozzarella, parmesan, swiss, and other hard cheeses. Fats and oils Butter-free spreads. Vegetable oils, such as olive, canola, and sunflower oil. Seasoning and other foods Artificial sweeteners with names that do not end in "ol," such as aspartame, saccharine, and stevia. Maple syrup, white table sugar, raw sugar, brown sugar, and molasses. Mayonnaise, soy sauce, and tamari. Fresh basil, coriander, parsley, rosemary, and thyme. Beverages Water and mineral water. Sugar-sweetened soft drinks. Small  amounts of orange juice or cranberry juice. Black and green tea. Most dry wines. Coffee. The items listed above may not be a complete list of foods and beverages you can eat. Contact a dietitian for more information. Foods to avoid Fruits Fresh, dried, and juiced forms of apple, pear, watermelon, peach, plum, cherries, apricots, blackberries, boysenberries, figs, nectarines, and mango. Avocado. Vegetables Chicory root, artichoke, asparagus, cabbage, snow peas, Brussels sprouts, broccoli, sugar snap peas, mushrooms, celery, and cauliflower. Onions, garlic, leeks, and the white part of scallions. Grains Wheat, including kamut, durum, and semolina. Barley and bulgur. Couscous. Wheat-based cereals. Wheat noodles, bread, crackers, and pastries. Meats and other proteins Fried or fatty meat. Sausage. Cashews and pistachios. Soybeans, baked beans, black beans, chickpeas, kidney beans, fava beans, navy beans, lentils, black-eyed peas, and split peas. Dairy Milk, yogurt, ice cream, and soft cheese. Cream and sour cream. Milk-based sauces. Custard. Buttermilk. Soy milk. Seasoning and other foods Any sugar-free gum or candy. Foods that contain artificial sweeteners such as sorbitol, mannitol, isomalt, or xylitol. Foods that contain honey,  high-fructose corn syrup, or agave. Bouillon, vegetable stock, beef stock, and chicken stock. Garlic and onion powder. Condiments made with onion, such as hummus, chutney, pickles, relish, salad dressing, and salsa. Tomato paste. Beverages Chicory-based drinks. Coffee substitutes. Chamomile tea. Fennel tea. Sweet or fortified wines such as port or sherry. Diet soft drinks made with isomalt, mannitol, maltitol, sorbitol, or xylitol. Apple, pear, and mango juice. Juices with high-fructose corn syrup. The items listed above may not be a complete list of foods and beverages you should avoid. Contact a dietitian for more information. Summary  FODMAP stands for fermentable  oligosaccharides, disaccharides, monosaccharides, and polyols. These are sugars that are hard for some people to digest.  A low-FODMAP eating plan is a short-term diet that helps to ease symptoms of certain bowel diseases.  The eating plan usually lasts up to 6 weeks. After that, high-FODMAP foods are reintroduced gradually and one at a time. This can help you find out which foods may be causing symptoms.  A low-FODMAP eating plan can be complicated. It is best to work with a dietitian who has experience with this type of plan. This information is not intended to replace advice given to you by your health care provider. Make sure you discuss any questions you have with your health care provider. Document Revised: 04/07/2020 Document Reviewed: 04/07/2020 Elsevier Patient Education  2021 Elsevier Inc.  Diet for Irritable Bowel Syndrome When you have irritable bowel syndrome (IBS), it is very important to eat the foods and follow the eating habits that are best for your condition. IBS may cause various symptoms such as pain in the abdomen, constipation, or diarrhea. Choosing the right foods can help to ease the discomfort from these symptoms. Work with your health care provider and diet and nutrition specialist (dietitian) to find the eating plan that will help to control your symptoms. What are tips for following this plan?  Keep a food diary. This will help you identify foods that cause symptoms. Write down: ? What you eat and when you eat it. ? What symptoms you have. ? When symptoms occur in relation to your meals, such as "pain in abdomen 2 hours after dinner."  Eat your meals slowly and in a relaxed setting.  Aim to eat 5-6 small meals per day. Do not skip meals.  Drink enough fluid to keep your urine pale yellow.  Ask your health care provider if you should take an over-the-counter probiotic to help restore healthy bacteria in your gut (digestive tract). ? Probiotics are foods that  contain good bacteria and yeasts.  Your dietitian may have specific dietary recommendations for you based on your symptoms. He or she may recommend that you: ? Avoid foods that cause symptoms. Talk with your dietitian about other ways to get the same nutrients that are in those problem foods. ? Avoid foods with gluten. Gluten is a protein that is found in rye, wheat, and barley. ? Eat more foods that contain soluble fiber. Examples of foods with high soluble fiber include oats, seeds, and certain fruits and vegetables. Take a fiber supplement if directed by your dietitian. ? Reduce or avoid certain foods called FODMAPs. These are foods that contain carbohydrates that are hard to digest. Ask your doctor which foods contain these carbohydrates.      What foods are not recommended? The following are some foods and drinks that may make your symptoms worse:  Fatty foods, such as french fries.  Foods that contain gluten, such as pasta  and cereal.  Dairy products, such as milk, cheese, and ice cream.  Chocolate.  Alcohol.  Products with caffeine, such as coffee.  Carbonated drinks, such as soda.  Foods that are high in FODMAPs. These include certain fruits and vegetables.  Products with sweeteners such as honey, high fructose corn syrup, sorbitol, and mannitol. The items listed above may not be a complete list of foods and beverages you should avoid. Contact a dietitian for more information.   What foods are good sources of fiber? Your health care provider or dietitian may recommend that you eat more foods that contain fiber. Fiber can help to reduce constipation and other IBS symptoms. Add foods with fiber to your diet a little at a time so your body can get used to them. Too much fiber at one time might cause gas and swelling of your abdomen. The following are some foods that are good sources of fiber:  Berries, such as raspberries, strawberries, and  blueberries.  Tomatoes.  Carrots.  Brown rice.  Oats.  Seeds, such as chia and pumpkin seeds. The items listed above may not be a complete list of recommended sources of fiber. Contact your dietitian for more options. Where to find more information  International Foundation for Functional Gastrointestinal Disorders: www.iffgd.[employer] of Diabetes and Digestive and Kidney Diseases: [employer] Summary  When you have irritable bowel syndrome (IBS), it is very important to eat the foods and follow the eating habits that are best for your condition.  IBS may cause various symptoms such as pain in the abdomen, constipation, or diarrhea.  Choosing the right foods can help to ease the discomfort that comes from symptoms.  Keep a food diary. This will help you identify foods that cause symptoms.  Your health care provider or diet and nutrition specialist (dietitian) may recommend that you eat more foods that contain fiber. This information is not intended to replace advice given to you by your health care provider. Make sure you discuss any questions you have with your health care provider. Document Revised: 07/21/2020 Document Reviewed: 07/21/2020 Elsevier Patient Education  2021 [employer].

## 2021-01-06 ENCOUNTER — Encounter: Payer: Self-pay | Admitting: Family Medicine

## 2021-01-31 NOTE — Progress Notes (Signed)
[place] Phone: [phone] Subjective:   [name], am serving as a scribe for Dr. Hulan Saas. This visit occurred during the SARS-CoV-2 public health emergency.  Safety protocols were in place, including screening questions prior to the visit, additional usage of staff PPE, and extensive cleaning of exam room while observing appropriate contact time as indicated for disinfecting solutions.   I'm seeing this patient by the request  of:  Billie Ruddy, MD  CC: Low back and left hip pain follow-up  12/22/2020 We discussed with patient that I do feel that there is some lumbar radiculopathy.  Differential includes the possibility of piriformis syndrome.  We do not have the MRI from the outside facility but has been told that it was more muscular in nature.  Patient does have a past medical history significant for osteoporosis and will get x-rays to rule out any type of occult fracture.  I think it is highly unlikely.  Patient does have some stiffness noted though.  Patient has Flexeril at this time and we did discuss the potential for gabapentin which patient declined.  We may need to consider that in the future as well as formal physical therapy.  Patient will start with home exercises first and we discussed over-the-counter natural vitamins.  Patient will follow-up with me again in 5 to 6 weeks and discussed once again the possibility of gabapentin and formal physical therapy or even advanced imaging if any weakness is noted   Update 02/01/2021 [id number]  Sherry Harrison is a 65 y.o. female coming in with complaint of low back pain. Patient states that she is here for xray results and wants to know if she is going to continue to have intermittent pain that radiates down left leg. Is having less of the radicular symptoms.  Patient does states that it is still affecting daily activities.  Patient states that 2  Sundays previous to this though patient was unable to do any activity secondary to the amount of pain in the left groin as well as radiation going past her knee on the lateral aspect of her shin.  Patient states that it makes it very difficult to even go up and down stairs when this does occur.  Patient continues to have some mild back discomfort but not as severe. Patient did have what appeared to be some calcific changes noted in the gluteal area overlying the left ilium and the left sacrum on x-ray.  Patient though states that that is not the area where she is as tender.    Past Medical History:  Diagnosis Date  . Cancer ([place])   . Hypertension    Past Surgical History:  Procedure Laterality Date  . TONSILLECTOMY     Social History   Socioeconomic History  . Marital status: Married    Spouse name: Not on file  . Number of children: Not on file  . Years of education: Not on file  . Highest education level: Not on file  Occupational History  . Not on file  Tobacco Use  . Smoking status: Former Research scientist (life sciences)  . Smokeless tobacco: Never Used  Substance and Sexual Activity  . Alcohol use: Yes  . Drug use: Never  . Sexual activity: Not on file  Other Topics Concern  . Not on file  Social History Narrative  . Not on file   Social Determinants of Health   Financial Resource Strain: Not on  file  Food Insecurity: Not on file  Transportation Needs: Not on file  Physical Activity: Not on file  Stress: Not on file  Social Connections: Not on file   No Known Allergies Family History  Problem Relation Age of Onset  . Breast cancer Mother        pagets disease of nipple     Current Outpatient Medications (Cardiovascular):  .  lisinopril-hydrochlorothiazide (ZESTORETIC) 10-12.5 MG tablet, TAKE 1 TABLET DAILY     Current Outpatient Medications (Other):  Marland Kitchen  Calcium Carb-Cholecalciferol (CALCIUM 600-D PO), Take 600 mg by mouth daily. Marland Kitchen  gabapentin (NEURONTIN) 100 MG capsule, Take 2  capsules (200 mg total) by mouth at bedtime. .  Multiple Vitamin (MULTIVITAMINS PO), Take by mouth.   Reviewed prior external information including notes and imaging from  primary care provider As well as notes that were available from care everywhere and other healthcare systems.  Past medical history, social, surgical and family history all reviewed in electronic medical record.  No pertanent information unless stated regarding to the chief complaint.   Review of Systems:  No headache, visual changes, nausea, vomiting, diarrhea, constipation, dizziness, abdominal pain, skin rash, fevers, chills, night sweats, weight loss, swollen lymph nodes, body aches, joint swelling, chest pain, shortness of breath, mood changes. POSITIVE muscle aches  Objective  Blood pressure 122/78, pulse 98, height 5\' 8"  (1.727 m), weight 150 lb (68 kg), SpO2 99 %.   General: No apparent distress alert and oriented x3 mood and affect normal, dressed appropriately.  HEENT: Pupils equal, extraocular movements intact  Respiratory: Patient's speak in full sentences and does not appear short of breath  Cardiovascular: No lower extremity edema, non tender, no erythema  Gait mild antalgic Left hip shows the patient still has decreased range of motion especially with external rotation lacking the last 10 to 15 degrees.  Internal rotation lacks last 5 degrees on the left hip.  Patient does nontender on palpation around the area.  Very mild tightness noted in the piriformis and mild discomfort over the sacroiliac joint.  Patient states has been issue for years but does feel like she is getting worse. Questionable radicular symptoms at 20 degrees of forward flexion of the hip with mild radicular symptoms down to the lateral knee.   Impression and Recommendations:     The above documentation has been reviewed and is accurate and complete Lyndal Pulley, DO

## 2021-02-01 ENCOUNTER — Other Ambulatory Visit: Payer: Self-pay

## 2021-02-01 ENCOUNTER — Encounter: Payer: Self-pay | Admitting: Family Medicine

## 2021-02-01 ENCOUNTER — Ambulatory Visit (INDEPENDENT_AMBULATORY_CARE_PROVIDER_SITE_OTHER): Payer: Medicare Other | Admitting: Family Medicine

## 2021-02-01 VITALS — BP 122/78 | HR 98 | Ht 68.0 in | Wt 150.0 lb

## 2021-02-01 DIAGNOSIS — E559 Vitamin D deficiency, unspecified: Secondary | ICD-10-CM

## 2021-02-01 DIAGNOSIS — M545 Low back pain, unspecified: Secondary | ICD-10-CM | POA: Diagnosis not present

## 2021-02-01 DIAGNOSIS — G8929 Other chronic pain: Secondary | ICD-10-CM

## 2021-02-01 DIAGNOSIS — M2550 Pain in unspecified joint: Secondary | ICD-10-CM

## 2021-02-01 MED ORDER — GABAPENTIN 100 MG PO CAPS: 200.0000 mg | ORAL_CAPSULE | Freq: Every day | ORAL | 3 refills | Status: DC

## 2021-02-01 NOTE — Assessment & Plan Note (Signed)
Patient continues to have more low back pain in the left hip pain.  He does have some mild impingement noted.  Patient's x-rays of the left hip does have some mild arthritic changes.  Patient continues to have tightness on the left but continues to have radicular symptoms more in the L5 corresponding vertebrae's.  X-rays of the lumbar spine did not show any true bony abnormality but patient continues to have radicular symptoms.  Discussed with patient about different treatment options.  Patient has done formal physical therapy multiple times in the past and recently is still doing this with dry needling and not noticing significant improvement.  Still affecting daily activities and sometimes can wake her up at night as well.  At this point we can consider advanced imaging and will get MRI lumbar as well as MR arthrogram of the left hip to further evaluate.  Discussed potential laboratory work-up for some of the other chronic pain as well.  Patient will have this done when she sees her primary care provider in the near future.  Follow-up with me again after imaging to discuss different treatment options.

## 2021-02-01 NOTE — Patient Instructions (Addendum)
Good to see you Labs at [facility] MRI lumbar MRA of left hip We will write you when we get all this done and talk about next steps Gabapentin 200 mg at night

## 2021-02-06 ENCOUNTER — Other Ambulatory Visit: Payer: Self-pay | Admitting: Family Medicine

## 2021-02-06 DIAGNOSIS — I10 Essential (primary) hypertension: Secondary | ICD-10-CM

## 2021-03-13 ENCOUNTER — Other Ambulatory Visit: Payer: Self-pay

## 2021-03-13 ENCOUNTER — Ambulatory Visit
Admission: RE | Admit: 2021-03-13 | Discharge: 2021-03-13 | Disposition: A | Discharge status: Home / Self Care | Payer: Medicare Other | Source: Ambulatory Visit | Attending: Family Medicine | Admitting: Family Medicine

## 2021-03-13 DIAGNOSIS — M545 Low back pain, unspecified: Secondary | ICD-10-CM

## 2021-03-13 DIAGNOSIS — M2550 Pain in unspecified joint: Secondary | ICD-10-CM

## 2021-03-13 MED ORDER — IOPAMIDOL (ISOVUE-M 200) INJECTION 41%
13.0000 mL | Freq: Once | INTRAMUSCULAR | Status: AC
  Administered 2021-03-13: 13 mL via INTRA_ARTICULAR

## 2021-03-14 ENCOUNTER — Encounter: Payer: Self-pay | Admitting: Family Medicine

## 2021-03-22 NOTE — Progress Notes (Signed)
[facility] Phone: [phone] Subjective:   [name], am serving as a scribe for Dr. Hulan Saas. This visit occurred during the SARS-CoV-2 public health emergency.  Safety protocols were in place, including screening questions prior to the visit, additional usage of staff PPE, and extensive cleaning of exam room while observing appropriate contact time as indicated for disinfecting solutions.   I'm seeing this patient by the request  of:  Billie Ruddy, MD  CC: Left hip and back pain follow-up  HEN:[id number]  ZHAVIA CUNANAN is a 65 y.o. female coming in with complaint of L hip and back pain. Here for MRI results. Pain is unchanged since last visit.  Continues to have pain in both places    MRI lumbar spine 03/13/2021  IMPRESSION: 1. Multilevel lumbar spondylosis most pronounced at the L4-5 level where there is mild-to-moderate canal stenosis, moderate bilateral subarticular recess stenosis, and mild bilateral foraminal stenosis. 2. Mild bilateral subarticular recess stenosis at L3-4 and L5-S1. 3. Mild left foraminal stenosis at L3-L4.  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 Debris containing paralabral cyst along the periphery of the acetabular roof on the left is again noted.  Incomplete visualization of mild appearing right hip osteoarthritis with a likely small paralabral cyst indicative of a labral tear.    Past Medical History:  Diagnosis Date  . Cancer ([place])   . Hypertension    Past Surgical History:  Procedure Laterality Date  . TONSILLECTOMY     Social History   Socioeconomic History  . Marital status: Married    Spouse name: Not on file  . Number of children: Not on file  . Years of education: Not on file  . Highest education level: Not on file   Occupational History  . Not on file  Tobacco Use  . Smoking status: Former Research scientist (life sciences)  . Smokeless tobacco: Never Used  Substance and Sexual Activity  . Alcohol use: Yes  . Drug use: Never  . Sexual activity: Not on file  Other Topics Concern  . Not on file  Social History Narrative  . Not on file   Social Determinants of Health   Financial Resource Strain: Not on file  Food Insecurity: Not on file  Transportation Needs: Not on file  Physical Activity: Not on file  Stress: Not on file  Social Connections: Not on file   No Known Allergies Family History  Problem Relation Age of Onset  . Breast cancer Mother        pagets disease of nipple     Current Outpatient Medications (Cardiovascular):  .  lisinopril-hydrochlorothiazide (ZESTORETIC) 10-12.5 MG tablet, TAKE 1 TABLET DAILY     Current Outpatient Medications (Other):  Marland Kitchen  Calcium Carb-Cholecalciferol (CALCIUM 600-D PO), Take 600 mg by mouth daily. Marland Kitchen  gabapentin (NEURONTIN) 100 MG capsule, Take 2 capsules (200 mg total) by mouth at bedtime. .  Multiple Vitamin (MULTIVITAMINS PO), Take by mouth.   Reviewed prior external information including notes and imaging from  primary care provider As well as notes that were available from care everywhere and other healthcare systems.  Past medical history, social, surgical and family history all reviewed in electronic medical record.  No pertanent information unless stated regarding to the chief complaint.   Review of Systems:  No headache, visual changes, nausea, vomiting, diarrhea, constipation, dizziness,  abdominal pain, skin rash, fevers, chills, night sweats, weight loss, swollen lymph nodes, body aches, joint swelling, chest pain, shortness of breath, mood changes. POSITIVE muscle aches  Objective  Blood pressure 140/72, pulse 82, height 5\' 8"  (1.727 m), weight 152 lb (68.9 kg), SpO2 99 %.   General: No apparent distress alert and oriented x3 mood and affect normal,  dressed appropriately.  HEENT: Pupils equal, extraocular movements intact  Respiratory: Patient's speak in full sentences and does not appear short of breath  Cardiovascular: No lower extremity edema, non tender, no erythema  Gait relatively normal Left hip exam though does have significant decrease in range of motion.  This is with internal and external range of motion.  Good strength though still noted. Back exam is minorly tender to palpation.  Mild loss of lordosis but did not do strength testing    Impression and Recommendations:     The above documentation has been reviewed and is accurate and complete Lyndal Pulley, DO

## 2021-03-23 ENCOUNTER — Encounter: Payer: Self-pay | Admitting: Family Medicine

## 2021-03-23 ENCOUNTER — Ambulatory Visit (INDEPENDENT_AMBULATORY_CARE_PROVIDER_SITE_OTHER): Payer: Medicare Other | Admitting: Family Medicine

## 2021-03-23 ENCOUNTER — Other Ambulatory Visit: Payer: Self-pay

## 2021-03-23 DIAGNOSIS — M48062 Spinal stenosis, lumbar region with neurogenic claudication: Secondary | ICD-10-CM

## 2021-03-23 DIAGNOSIS — M1612 Unilateral primary osteoarthritis, left hip: Secondary | ICD-10-CM

## 2021-03-23 NOTE — Patient Instructions (Signed)
Dr. Rhona Raider and Dr. Maureen Ralphs Talk to your daughter IF you have any questions write to me in MyChart Write me when you decide where you want referral

## 2021-03-23 NOTE — Assessment & Plan Note (Signed)
Left hip arthritis on MRI  Discussed HEP  At this point I think patient has failed most other conservative therapy at this time.  I do feel patient would likely do very well though with patient having a hip replacement.  Patient will consider this.  Depending on how patient responds we will refer her to where she would like to go.  Patient can call if she has any questions.  Total time reviewing patient's imaging, reviewing previous notes, discussing with patient greater than 33 minutes

## 2021-03-23 NOTE — Assessment & Plan Note (Signed)
Patient does MRI shows the patient does have mild to moderate canal stenosis at L4-L5 that does consistent with some of her radicular symptoms.  Patient has done formal physical therapy, gabapentin, and has been very active.  Patient does have good core strength.  Discussed with patient though I do feel that patient's hip is likely more contributing to more of the discomfort and pain in the back at the moment.  Patient we will consider that the only treatment for the back at this moment would be an epidural at this level.  Otherwise patient would be referred for the arthritis in the hip.  Patient will follow up with me again more on an as-needed basis but can send message to [place] if she has any questions and if she decides on referral versus epidural.

## 2021-04-13 ENCOUNTER — Encounter: Payer: Self-pay | Admitting: Family Medicine

## 2021-04-19 ENCOUNTER — Encounter: Payer: Self-pay | Admitting: Family Medicine

## 2021-04-24 ENCOUNTER — Other Ambulatory Visit: Payer: Self-pay

## 2021-04-24 ENCOUNTER — Ambulatory Visit (INDEPENDENT_AMBULATORY_CARE_PROVIDER_SITE_OTHER): Payer: Medicare Other | Admitting: Family Medicine

## 2021-04-24 ENCOUNTER — Encounter: Payer: Self-pay | Admitting: Family Medicine

## 2021-04-24 VITALS — BP 118/72 | HR 78 | Temp 98.1°F | Ht 69.0 in | Wt 147.0 lb

## 2021-04-24 DIAGNOSIS — E559 Vitamin D deficiency, unspecified: Secondary | ICD-10-CM

## 2021-04-24 DIAGNOSIS — M810 Age-related osteoporosis without current pathological fracture: Secondary | ICD-10-CM | POA: Diagnosis not present

## 2021-04-24 DIAGNOSIS — M25552 Pain in left hip: Secondary | ICD-10-CM

## 2021-04-24 DIAGNOSIS — I10 Essential (primary) hypertension: Secondary | ICD-10-CM | POA: Diagnosis not present

## 2021-04-24 DIAGNOSIS — M1612 Unilateral primary osteoarthritis, left hip: Secondary | ICD-10-CM | POA: Diagnosis not present

## 2021-04-24 DIAGNOSIS — Z23 Encounter for immunization: Secondary | ICD-10-CM

## 2021-04-24 DIAGNOSIS — M2550 Pain in unspecified joint: Secondary | ICD-10-CM | POA: Diagnosis not present

## 2021-04-24 DIAGNOSIS — G8929 Other chronic pain: Secondary | ICD-10-CM

## 2021-04-24 DIAGNOSIS — Z13220 Encounter for screening for lipoid disorders: Secondary | ICD-10-CM

## 2021-04-24 DIAGNOSIS — Z0000 Encounter for general adult medical examination without abnormal findings: Secondary | ICD-10-CM | POA: Diagnosis not present

## 2021-04-24 DIAGNOSIS — Z1159 Encounter for screening for other viral diseases: Secondary | ICD-10-CM

## 2021-04-24 DIAGNOSIS — M48062 Spinal stenosis, lumbar region with neurogenic claudication: Secondary | ICD-10-CM

## 2021-04-24 LAB — IBC PANEL
Iron: 111 ug/dL (ref 42–145)
Saturation Ratios: 28.7 % (ref 20.0–50.0)
Transferrin: 276 mg/dL (ref 212.0–360.0)

## 2021-04-24 LAB — BASIC METABOLIC PANEL
BUN: 10 mg/dL (ref 6–23)
CO2: 30 mEq/L (ref 19–32)
Calcium: 9.7 mg/dL (ref 8.4–10.5)
Chloride: 97 mEq/L (ref 96–112)
Creatinine, Ser: 0.87 mg/dL (ref 0.40–1.20)
GFR: 69.84 mL/min (ref >60.00)
Glucose, Bld: 103 mg/dL — ABNORMAL HIGH (ref 70–99)
Potassium: 4.6 mEq/L (ref 3.5–5.1)
Sodium: 135 mEq/L (ref 135–145)

## 2021-04-24 LAB — CBC WITH DIFFERENTIAL/PLATELET
Basophils Absolute: 0 10*3/uL (ref 0.0–0.1)
Basophils Relative: 0.3 % (ref 0.0–3.0)
Eosinophils Absolute: 0.1 10*3/uL (ref 0.0–0.7)
Eosinophils Relative: 1.1 % (ref 0.0–5.0)
HCT: 40.3 % (ref 36.0–46.0)
Hemoglobin: 13.9 g/dL (ref 12.0–15.0)
Lymphocytes Relative: 41.2 % (ref 12.0–46.0)
Lymphs Abs: 2 10*3/uL (ref 0.7–4.0)
MCHC: 34.5 g/dL (ref 30.0–36.0)
MCV: 92.8 fl (ref 78.0–100.0)
Monocytes Absolute: 0.4 10*3/uL (ref 0.1–1.0)
Monocytes Relative: 7.9 % (ref 3.0–12.0)
Neutro Abs: 2.4 10*3/uL (ref 1.4–7.7)
Neutrophils Relative %: 49.5 % (ref 43.0–77.0)
Platelets: 315 10*3/uL (ref 150.0–400.0)
RBC: 4.34 Mil/uL (ref 3.87–5.11)
RDW: 12.2 % (ref 11.5–15.5)
WBC: 4.9 10*3/uL (ref 4.0–10.5)

## 2021-04-24 LAB — LIPID PANEL
Cholesterol: 266 mg/dL — ABNORMAL HIGH (ref 0–200)
HDL: 75.7 mg/dL (ref >39.00)
LDL Cholesterol: 163 mg/dL — ABNORMAL HIGH (ref 0–99)
NonHDL: 190.07
Total CHOL/HDL Ratio: 4
Triglycerides: 134 mg/dL (ref 0.0–149.0)
VLDL: 26.8 mg/dL (ref 0.0–40.0)

## 2021-04-24 LAB — FERRITIN: Ferritin: 55.3 ng/mL (ref 10.0–291.0)

## 2021-04-24 LAB — SEDIMENTATION RATE: Sed Rate: 13 mm/hr (ref 0–30)

## 2021-04-24 LAB — TSH: TSH: 3.24 u[IU]/mL (ref 0.35–4.50)

## 2021-04-24 LAB — T4, FREE: Free T4: 0.73 ng/dL (ref 0.60–1.60)

## 2021-04-24 LAB — C-REACTIVE PROTEIN: CRP: <1 mg/dL (ref 0.5–20.0)

## 2021-04-24 LAB — VITAMIN D 25 HYDROXY (VIT D DEFICIENCY, FRACTURES): VITD: 47.82 ng/mL (ref 30.00–100.00)

## 2021-04-24 MED ORDER — LISINOPRIL-HYDROCHLOROTHIAZIDE 10-12.5 MG PO TABS: 1.0000 | ORAL_TABLET | Freq: Every day | ORAL | 3 refills | Status: DC

## 2021-04-24 NOTE — Progress Notes (Signed)
Subjective:   Sherry Harrison is a 65 y.o. female who presents for an Initial Medicare Annual Wellness Visit.  Pt states she has been doing well.  Still having L hip and low back pain.  Seen by Dr. Tamala Julian, Sport med.  Pt had dry needling and trigger point injections while in [place] that would last several months, but they are no longer working.  Pt had MRI on L hip and spine which had concern of labral tear, spinal stenosis, and bulging disc(s).  Pt never knows when her hip will start hurting.  It makes it difficult to go up stairs when it is bothering her.  Pt given gabapentin, but not having the pain/numbness radiating from low back into L leg.   Pt mentions previous h/o osteoporosis.  Has not had a bone density scan in a few yrs.  Review of Systems    +L hip pain, low back pain       Objective:    Today's Vitals   04/24/21 0934  BP: 118/72  Pulse: 78  Temp: 98.1 F (36.7 C)  TempSrc: Oral  SpO2: 97%  Weight: 147 lb (66.7 kg)  Height: 5\' 9"  (1.753 m)   Body mass index is 21.71 kg/m.  No flowsheet data found.   Gen. Pleasant, well developed, well-nourished, in NAD HEENT - [place]/AT, PERRL, EOMI, conjunctive clear, no scleral icterus, no nasal drainage, pharynx without erythema or exudate.  TMs normal bilaterally.  No cervical lymphadenopathy. Neck: No JVD, no thyromegaly, no carotid bruits Lungs: no use of accessory muscles, CTAB, no wheezes, rales or rhonchi Cardiovascular: RRR,  No r/g/m, no peripheral edema Abdomen: BS present, soft, nontender,nondistended, no hepatosplenomegaly Musculoskeletal: No deformities, moves all four extremities, no cyanosis or clubbing, normal tone Neuro:  A&Ox3, CN II-XII intact, normal gait Skin:  Warm, dry, intact, no lesions Psych: normal affect, mood appropriate   Current Medications (verified) Outpatient Encounter Medications as of 04/24/2021  Medication Sig  . Calcium Carb-Cholecalciferol (CALCIUM 600-D PO) Take 600 mg by mouth daily.  Marland Kitchen  lisinopril-hydrochlorothiazide (ZESTORETIC) 10-12.5 MG tablet TAKE 1 TABLET DAILY  . Multiple Vitamin (MULTIVITAMINS PO) Take by mouth.  . gabapentin (NEURONTIN) 100 MG capsule Take 2 capsules (200 mg total) by mouth at bedtime. (Patient not taking: Reported on 04/24/2021)   No facility-administered encounter medications on file as of 04/24/2021.    Allergies (verified) Patient has no known allergies.   History: Past Medical History:  Diagnosis Date  . Cancer ([place])   . Hypertension    Past Surgical History:  Procedure Laterality Date  . TONSILLECTOMY     Family History  Problem Relation Age of Onset  . Breast cancer Mother        pagets disease of nipple   Social History   Socioeconomic History  . Marital status: Married    Spouse name: Not on file  . Number of children: Not on file  . Years of education: Not on file  . Highest education level: Not on file  Occupational History  . Not on file  Tobacco Use  . Smoking status: Former Research scientist (life sciences)  . Smokeless tobacco: Never Used  Substance and Sexual Activity  . Alcohol use: Yes  . Drug use: Never  . Sexual activity: Not on file  Other Topics Concern  . Not on file  Social History Narrative  . Not on file   Social Determinants of Health   Financial Resource Strain: Not on file  Food Insecurity: Not on file  Transportation Needs: Not on file  Physical Activity: Not on file  Stress: Not on file  Social Connections: Not on file    Tobacco Counseling Counseling given: Not Answered    Activities of Daily Living No flowsheet data found.  Patient Care Team: Billie Ruddy, MD as PCP - General (Family Medicine)  Indicate any recent Medical Services you may have received from other than [facility] providers in the past year (date may be approximate).     Assessment:   This is a routine wellness examination for Bude.  Hearing/Vision screen  Hearing Screening   125Hz  250Hz  500Hz  1000Hz  2000Hz  3000Hz  4000Hz  6000Hz   8000Hz   Right ear:   Pass Pass Pass  Pass    Left ear:   Fail Pass Pass  Pass      Visual Acuity Screening   Right eye Left eye Both eyes  Without correction:     With correction: 20/25 20/25 20/20     Dietary issues and exercise activities discussed:    Goals Addressed   None    Depression Screen No flowsheet data found.  Fall Risk Fall Risk  04/24/2021  Falls in the past year? 0    FALL RISK PREVENTION PERTAINING TO THE HOME:  Any stairs in or around the home? Yes  If so, are there any without handrails? No  Home free of loose throw rugs in walkways, pet beds, electrical cords, etc? Yes  Adequate lighting in your home to reduce risk of falls? Yes   ASSISTIVE DEVICES UTILIZED TO PREVENT FALLS:  Life alert? No  Use of a cane, walker or w/c? No  Grab bars in the bathroom? No  Shower chair or bench in shower? No  Elevated toilet seat or a handicapped toilet? No   TIMED UP AND GO:  Was the test performed? No .   Gait steady and fast without use of assistive device  Cognitive Function:  A&O x 3, no delay appreciated   Immunizations Immunization History  Administered Date(s) Administered  . Influenza Inj Mdck Quad Pf 09/19/2016  . Influenza,inj,Quad PF,6+ Mos 09/18/2018, 08/25/2019  . Influenza,inj,quad, With Preservative 08/20/2017  . Influenza-Unspecified 09/03/2015  . PFIZER(Purple Top)SARS-COV-2 Vaccination 02/15/2020, 03/17/2020, 10/03/2020, 03/03/2021  . Tdap 11/02/2014  . Zoster 02/01/2016  . Zoster Recombinat (Shingrix) 09/09/2019, 02/12/2020    TDAP status: Up to date  Flu Vaccine status: Up to date  Pneumococcal vaccine status: Completed during today's visit.  Covid-19 vaccine status: Completed vaccines  Qualifies for Shingles Vaccine? Yes   Zostavax completed Yes   Shingrix Completed?: Yes  Screening Tests Health Maintenance  Topic Date Due  . HIV Screening  Never done  . Hepatitis C Screening  Never done  . PAP SMEAR-Modifier  Never  done  . DEXA SCAN  Never done  . PNA vac Low Risk Adult (1 of 2 - PCV13) Never done  . INFLUENZA VACCINE  07/03/2021  . MAMMOGRAM  12/28/2022  . TETANUS/TDAP  11/16/2024  . COLONOSCOPY (Pts 45-47yrs Insurance coverage will need to be confirmed)  03/23/2026  . COVID-19 Vaccine  Completed  . HPV VACCINES  Aged Out    Health Maintenance  Health Maintenance Due  Topic Date Due  . HIV Screening  Never done  . Hepatitis C Screening  Never done  . PAP SMEAR-Modifier  Never done  . DEXA SCAN  Never done  . PNA vac Low Risk Adult (1 of 2 - PCV13) Never done    Colorectal cancer screening: Type of screening:  Colonoscopy. Completed 03/19/2011.   Mammogram status: Completed 07/27/2020. Repeat every year  Bone Density status: Ordered this visit. Pt provided with contact info and advised to call to schedule appt.  Lung Cancer Screening: (Low Dose CT Chest recommended if Age 30-80 years, 30 pack-year currently smoking OR have quit w/in 15years.) does not qualify.   Additional Screening:  Hepatitis C Screening: does qualify; Completed this visit.  Vision Screening: Recommended annual ophthalmology exams for early detection of glaucoma and other disorders of the eye. Is the patient up to date with their annual eye exam?  Yes   Dental Screening: Recommended annual dental exams for proper oral hygiene  Community Resource Referral / Chronic Care Management: CRR required this visit?  No   CCM required this visit?  No     Plan:    Initial Medicare annual wellness visit -Patient has a living will -Given information about healthcare power of attorney, MOST  Osteoporosis without current pathological fracture, unspecified osteoporosis type -per pt -continue weight bearing exercises -optimize vit D and calcium intake  - Plan: Basic metabolic panel, DG Bone Density  Chronic left hip pain  -likely 2/2 labral tear and arthritis -continue f/u with Sports med. -pt to decided on Ortho  referral after contacting insurance company - Plan: CBC with Differential/Platelet  Encounter for hepatitis C screening test for low risk patient - Plan: Hep C Antibody  Need for vaccination against Streptococcus pneumoniae -given this visit, PCV 20  Arthritis of left hip  - Plan: CBC with Differential/Platelet  Spinal stenosis, lumbar region, with neurogenic claudication  Essential hypertension  -controlled -continue lisinopril-hctz 10-12.5 mg daily -lifestyle modifications - Plan: TSH, T4, Free, lisinopril-hydrochlorothiazide (ZESTORETIC) 10-12.5 MG tablet  Screening for cholesterol level  - Plan: Lipid panel  Polyarthralgia  - Plan: PTH, intact and calcium, Calcium, ionized, Ferritin, IBC panel, ANA, C-reactive protein, Sedimentation rate  Vitamin D deficiency  - Plan: VITAMIN D 25 Hydroxy (Vit-D Deficiency, Fractures)   I have personally reviewed and noted the following in the patient's chart:   . Medical and social history . Use of alcohol, tobacco or illicit drugs  . Current medications and supplements including opioid prescriptions. Patient is not currently taking opioid prescriptions. . Functional ability and status . Nutritional status . Physical activity . Advanced directives . List of other physicians . Hospitalizations, surgeries, and ER visits in previous 12 months . Vitals . Screenings to include cognitive, depression, and falls . Referrals and appointments  In addition, I have reviewed and discussed with patient certain preventive protocols, quality metrics, and best practice recommendations. A written personalized care plan for preventive services as well as general preventive health recommendations were provided to patient.     Billie Ruddy, MD   04/24/2021

## 2021-04-24 NOTE — Patient Instructions (Signed)
Preventive Care 66 Years and Older, Female Preventive care refers to lifestyle choices and visits with your health care provider that can promote health and wellness. This includes:  A yearly physical exam. This is also called an annual wellness visit.  Regular dental and eye exams.  Immunizations.  Screening for certain conditions.  Healthy lifestyle choices, such as: ? Eating a healthy diet. ? Getting regular exercise. ? Not using drugs or products that contain nicotine and tobacco. ? Limiting alcohol use. What can I expect for my preventive care visit? Physical exam Your health care provider will check your:  Height and weight. These may be used to calculate your BMI (body mass index). BMI is a measurement that tells if you are at a healthy weight.  Heart rate and blood pressure.  Body temperature.  Skin for abnormal spots. Counseling Your health care provider may ask you questions about your:  Past medical problems.  Family's medical history.  Alcohol, tobacco, and drug use.  Emotional well-being.  Home life and relationship well-being.  Sexual activity.  Diet, exercise, and sleep habits.  History of falls.  Memory and ability to understand (cognition).  Work and work Statistician.  Pregnancy and menstrual history.  Access to firearms. What immunizations do I need? Vaccines are usually given at various ages, according to a schedule. Your health care provider will recommend vaccines for you based on your age, medical history, and lifestyle or other factors, such as travel or where you work.   What tests do I need? Blood tests  Lipid and cholesterol levels. These may be checked every 5 years, or more often depending on your overall health.  Hepatitis C test.  Hepatitis B test. Screening  Lung cancer screening. You may have this screening every year starting at age 74 if you have a 30-pack-year history of smoking and currently smoke or have quit within  the past 15 years.  Colorectal cancer screening. ? All adults should have this screening starting at age 44 and continuing until age 58. ? Your health care provider may recommend screening at age 2 if you are at increased risk. ? You will have tests every 1-10 years, depending on your results and the type of screening test.  Diabetes screening. ? This is done by checking your blood sugar (glucose) after you have not eaten for a while (fasting). ? You may have this done every 1-3 years.  Mammogram. ? This may be done every 1-2 years. ? Talk with your health care provider about how often you should have regular mammograms.  Abdominal aortic aneurysm (AAA) screening. You may need this if you are a current or former smoker.  BRCA-related cancer screening. This may be done if you have a family history of breast, ovarian, tubal, or peritoneal cancers. Other tests  STD (sexually transmitted disease) testing, if you are at risk.  Bone density scan. This is done to screen for osteoporosis. You may have this done starting at age 104. Talk with your health care provider about your test results, treatment options, and if necessary, the need for more tests. Follow these instructions at home: Eating and drinking  Eat a diet that includes fresh fruits and vegetables, whole grains, lean protein, and low-fat dairy products. Limit your intake of foods with high amounts of sugar, saturated fats, and salt.  Take vitamin and mineral supplements as recommended by your health care provider.  Do not drink alcohol if your health care provider tells you not to drink.  If you drink alcohol: ? Limit how much you have to 0-1 drink a day. ? Be aware of how much alcohol is in your drink. In the [place] one drink equals one 12 oz bottle of beer (355 mL), one 5 oz glass of wine (148 mL), or one 1 oz glass of hard liquor (44 mL).   Lifestyle  Take daily care of your teeth and gums. Brush your teeth every morning  and night with fluoride toothpaste. Floss one time each day.  Stay active. Exercise for at least 30 minutes 5 or more days each week.  Do not use any products that contain nicotine or tobacco, such as cigarettes, e-cigarettes, and chewing tobacco. If you need help quitting, ask your health care provider.  Do not use drugs.  If you are sexually active, practice safe sex. Use a condom or other form of protection in order to prevent STIs (sexually transmitted infections).  Talk with your health care provider about taking a low-dose aspirin or statin.  Find healthy ways to cope with stress, such as: ? Meditation, yoga, or listening to music. ? Journaling. ? Talking to a trusted person. ? Spending time with friends and family. Safety  Always wear your seat belt while driving or riding in a vehicle.  Do not drive: ? If you have been drinking alcohol. Do not ride with someone who has been drinking. ? When you are tired or distracted. ? While texting.  Wear a helmet and other protective equipment during sports activities.  If you have firearms in your house, make sure you follow all gun safety procedures. What's next?  Visit your health care provider once a year for an annual wellness visit.  Ask your health care provider how often you should have your eyes and teeth checked.  Stay up to date on all vaccines. This information is not intended to replace advice given to you by your health care provider. Make sure you discuss any questions you have with your health care provider. Document Revised: 11/09/2020 Document Reviewed: 11/13/2018 Elsevier Patient Education  2021 [employer] [place].  Osteoporosis  Osteoporosis happens when the bones become thin and less dense than normal. Osteoporosis makes bones more brittle and fragile and more likely to break (fracture). Over time, osteoporosis can cause your bones to become so weak that they fracture after a minor fall. Bones in the hip, wrist,  and spine are most likely to fracture due to osteoporosis. What are the causes? The exact cause of this condition is not known. What increases the risk? You are more likely to develop this condition if you:  Have family members with this condition.  Have poor nutrition.  Use the following: ? Steroid medicines, such as prednisone. ? Anti-seizure medicines. ? Nicotine or tobacco, such as cigarettes, e-cigarettes, and chewing tobacco.  Are female.  Are age 20 or older.  Are not physically active (are sedentary).  Are of European or Asian descent.  Have a small body frame. What are the signs or symptoms? A fracture might be the first sign of osteoporosis, especially if the fracture results from a fall or injury that usually would not cause a bone to break. Other signs and symptoms include:  Pain in the neck or low back.  Stooped posture.  Loss of height. How is this diagnosed? This condition may be diagnosed based on:  Your medical history.  A physical exam.  A bone mineral density test, also called a DXA or DEXA test (dual-energy X-ray absorptiometry  test). This test uses X-rays to measure the amount of minerals in your bones. How is this treated? This condition may be treated by:  Making lifestyle changes, such as: ? Including foods with more calcium and vitamin D in your diet. ? Doing weight-bearing and muscle-strengthening exercises. ? Stopping tobacco use. ? Limiting alcohol intake.  Taking medicine to slow the process of bone loss or to increase bone density.  Taking daily supplements of calcium and vitamin D.  Taking hormone replacement medicines, such as estrogen for women and testosterone for men.  Monitoring your levels of calcium and vitamin D. The goal of treatment is to strengthen your bones and lower your risk for a fracture. Follow these instructions at home: Eating and drinking Include calcium and vitamin D in your diet. Calcium is important for  bone health, and vitamin D helps your body absorb calcium. Good sources of calcium and vitamin D include:  Certain fatty fish, such as salmon and tuna.  Products that have calcium and vitamin D added to them (are fortified), such as fortified cereals.  Egg yolks.  Cheese.  Liver.   Activity Do exercises as told by your health care provider. Ask your health care provider what exercises and activities are safe for you. You should do:  Exercises that make you work against gravity (weight-bearing exercises), such as tai chi, yoga, or walking.  Exercises to strengthen muscles, such as lifting weights. Lifestyle  Do not drink alcohol if: ? Your health care provider tells you not to drink. ? You are pregnant, may be pregnant, or are planning to become pregnant.  If you drink alcohol: ? Limit how much you use to:  0-1 drink a day for women.  0-2 drinks a day for men.  Know how much alcohol is in your drink. In the [place] one drink equals one 12 oz bottle of beer (355 mL), one 5 oz glass of wine (148 mL), or one 1 oz glass of hard liquor (44 mL).  Do not use any products that contain nicotine or tobacco, such as cigarettes, e-cigarettes, and chewing tobacco. If you need help quitting, ask your health care provider. Preventing falls  Use devices to help you move around (mobility aids) as needed, such as canes, walkers, scooters, or crutches.  Keep rooms well-lit and clutter-free.  Remove tripping hazards from walkways, including cords and throw rugs.  Install grab bars in bathrooms and safety rails on stairs.  Use rubber mats in the bathroom and other areas that are often wet or slippery.  Wear closed-toe shoes that fit well and support your feet. Wear shoes that have rubber soles or low heels.  Review your medicines with your health care provider. Some medicines can cause dizziness or changes in blood pressure, which can increase your risk of falling. General  instructions  Take over-the-counter and prescription medicines only as told by your health care provider.  Keep all follow-up visits. This is important. Contact a health care provider if:  You have never been screened for osteoporosis and you are: ? A woman who is age 85 or older. ? A man who is age 25 or older. Get help right away if:  You fall or injure yourself. Summary  Osteoporosis is thinning and loss of density in your bones. This makes bones more brittle and fragile and more likely to break (fracture),even with minor falls.  The goal of treatment is to strengthen your bones and lower your risk for a fracture.  Include calcium  and vitamin D in your diet. Calcium is important for bone health, and vitamin D helps your body absorb calcium.  Talk with your health care provider about screening for osteoporosis if you are a woman who is age 18 or older, or a man who is age 71 or older. This information is not intended to replace advice given to you by your health care provider. Make sure you discuss any questions you have with your health care provider. Document Revised: 05/05/2020 Document Reviewed: 05/05/2020 Elsevier Patient Education  [place] Maintenance After Age 11 After age 6, you are at a higher risk for certain long-term diseases and infections as well as injuries from falls. Falls are a major cause of broken bones and head injuries in people who are older than age 37. Getting regular preventive care can help to keep you healthy and well. Preventive care includes getting regular testing and making lifestyle changes as recommended by your health care provider. Talk with your health care provider about:  Which screenings and tests you should have. A screening is a test that checks for a disease when you have no symptoms.  A diet and exercise plan that is right for you. What should I know about screenings and tests to prevent falls? Screening and testing  are the best ways to find a health problem early. Early diagnosis and treatment give you the best chance of managing medical conditions that are common after age 59. Certain conditions and lifestyle choices may make you more likely to have a fall. Your health care provider may recommend:  Regular vision checks. Poor vision and conditions such as cataracts can make you more likely to have a fall. If you wear glasses, make sure to get your prescription updated if your vision changes.  Medicine review. Work with your health care provider to regularly review all of the medicines you are taking, including over-the-counter medicines. Ask your health care provider about any side effects that may make you more likely to have a fall. Tell your health care provider if any medicines that you take make you feel dizzy or sleepy.  Osteoporosis screening. Osteoporosis is a condition that causes the bones to get weaker. This can make the bones weak and cause them to break more easily.  Blood pressure screening. Blood pressure changes and medicines to control blood pressure can make you feel dizzy.  Strength and balance checks. Your health care provider may recommend certain tests to check your strength and balance while standing, walking, or changing positions.  Foot health exam. Foot pain and numbness, as well as not wearing proper footwear, can make you more likely to have a fall.  Depression screening. You may be more likely to have a fall if you have a fear of falling, feel emotionally low, or feel unable to do activities that you used to do.  Alcohol use screening. Using too much alcohol can affect your balance and may make you more likely to have a fall. What actions can I take to lower my risk of falls? General instructions  Talk with your health care provider about your risks for falling. Tell your health care provider if: ? You fall. Be sure to tell your health care provider about all falls, even ones  that seem minor. ? You feel dizzy, sleepy, or off-balance.  Take over-the-counter and prescription medicines only as told by your health care provider. These include any supplements.  Eat a healthy diet and maintain a healthy  weight. A healthy diet includes low-fat dairy products, low-fat (lean) meats, and fiber from whole grains, beans, and lots of fruits and vegetables. Home safety  Remove any tripping hazards, such as rugs, cords, and clutter.  Install safety equipment such as grab bars in bathrooms and safety rails on stairs.  Keep rooms and walkways well-lit. Activity  Follow a regular exercise program to stay fit. This will help you maintain your balance. Ask your health care provider what types of exercise are appropriate for you.  If you need a cane or walker, use it as recommended by your health care provider.  Wear supportive shoes that have nonskid soles.   Lifestyle  Do not drink alcohol if your health care provider tells you not to drink.  If you drink alcohol, limit how much you have: ? 0-1 drink a day for women. ? 0-2 drinks a day for men.  Be aware of how much alcohol is in your drink. In the [place] one drink equals one typical bottle of beer (12 oz), one-half glass of wine (5 oz), or one shot of hard liquor (1 oz).  Do not use any products that contain nicotine or tobacco, such as cigarettes and e-cigarettes. If you need help quitting, ask your health care provider. Summary  Having a healthy lifestyle and getting preventive care can help to protect your health and wellness after age 85.  Screening and testing are the best way to find a health problem early and help you avoid having a fall. Early diagnosis and treatment give you the best chance for managing medical conditions that are more common for people who are older than age 52.  Falls are a major cause of broken bones and head injuries in people who are older than age 66. Take precautions to prevent a fall  at home.  Work with your health care provider to learn what changes you can make to improve your health and wellness and to prevent falls. This information is not intended to replace advice given to you by your health care provider. Make sure you discuss any questions you have with your health care provider. Document Revised: 03/12/2019 Document Reviewed: 10/02/2017 Elsevier Patient Education  2021 [employer].

## 2021-04-26 LAB — HEPATITIS C ANTIBODY
Hepatitis C Ab: NONREACTIVE
SIGNAL TO CUT-OFF: 0.01 (ref <1.00)

## 2021-04-26 LAB — PTH, INTACT AND CALCIUM
Calcium: 9.8 mg/dL (ref 8.6–10.4)
PTH: 15 pg/mL — ABNORMAL LOW (ref 16–77)

## 2021-04-26 LAB — ANA: Anti Nuclear Antibody (ANA): NEGATIVE

## 2021-04-26 LAB — CALCIUM, IONIZED: Calcium, Ion: 4.82 mg/dL (ref 4.8–5.6)

## 2021-04-26 NOTE — Progress Notes (Signed)
Patient viewed results on MyChart. 

## 2021-05-05 ENCOUNTER — Ambulatory Visit (INDEPENDENT_AMBULATORY_CARE_PROVIDER_SITE_OTHER)
Admission: RE | Admit: 2021-05-05 | Discharge: 2021-05-05 | Disposition: A | Discharge status: Home / Self Care | Payer: Medicare Other | Source: Ambulatory Visit | Attending: Family Medicine | Admitting: Family Medicine

## 2021-05-05 ENCOUNTER — Other Ambulatory Visit: Payer: Self-pay

## 2021-05-05 DIAGNOSIS — M810 Age-related osteoporosis without current pathological fracture: Secondary | ICD-10-CM | POA: Diagnosis not present

## 2021-05-08 NOTE — Progress Notes (Signed)
Patient viewed results on MyChart. 

## 2021-05-10 ENCOUNTER — Other Ambulatory Visit: Payer: Self-pay

## 2021-05-10 DIAGNOSIS — M1612 Unilateral primary osteoarthritis, left hip: Secondary | ICD-10-CM

## 2021-07-09 IMAGING — MR MR HIP*L* W/CM
4 of 8 series · 16 of 40 positions shown · IV contrast (agent unspecified)
Comparison: None.

CLINICAL DATA: Chronic left hip pain.  No known injury.

EXAM:
MRI OF THE LEFT HIP WITH CONTRAST (MR Arthrogram)
TECHNIQUE: Multiplanar, multisequence MR imaging of the hip was performed
immediately following contrast injection into the hip joint under
fluoroscopic guidance. No intravenous contrast was administered.

[Series 3: T1 · coronal · 4.0mm · 0.53mm/px · 4 of 24 slices shown]
[im 1/24]
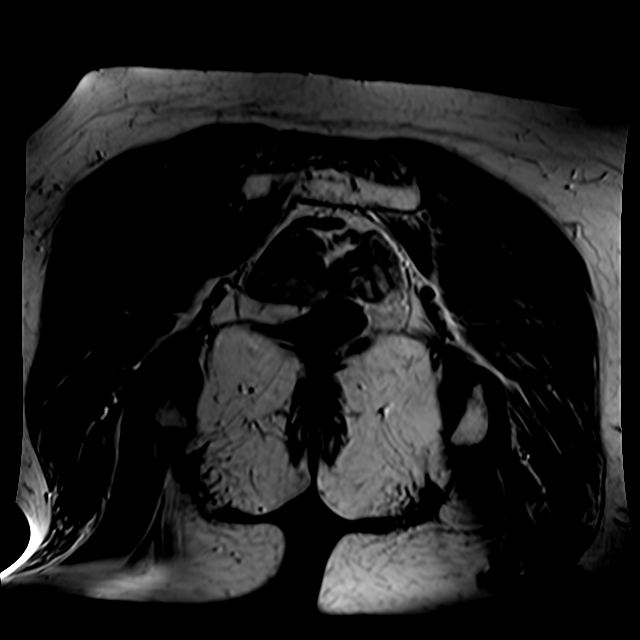
[im 8/24]
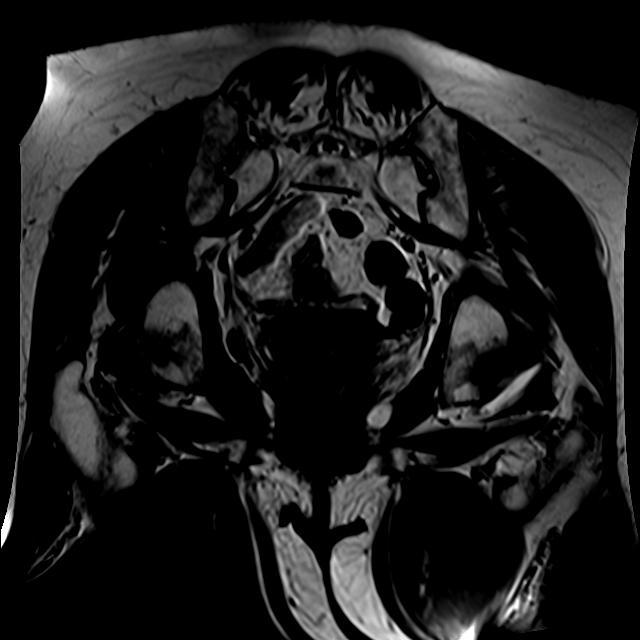
[im 16/24]
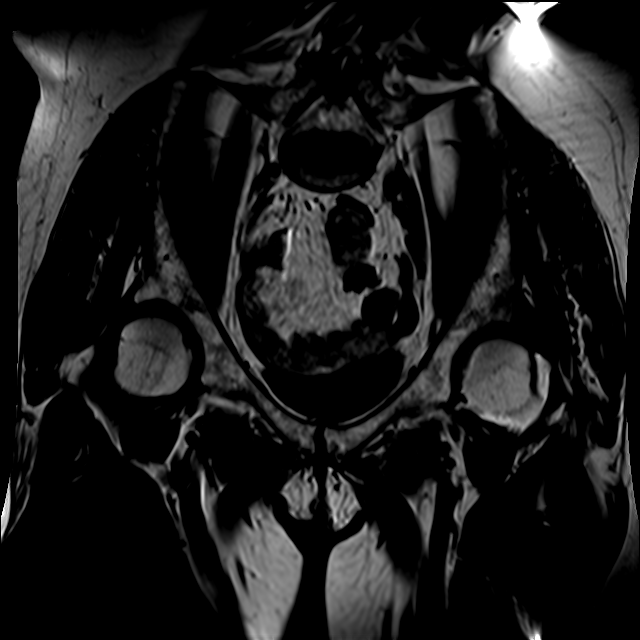
[im 24/24]
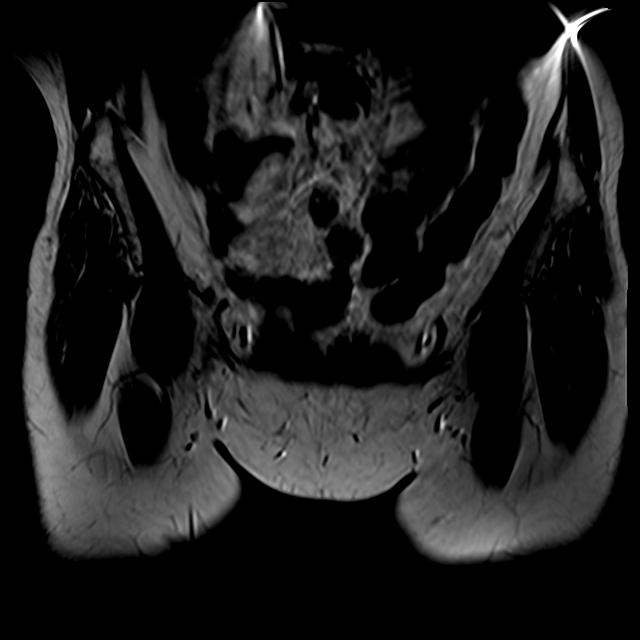

[Series 4: T2 fat-sat · coronal · 4.0mm · 0.53mm/px · 4 of 24 slices shown (1 of 2)]
[im 1/24]
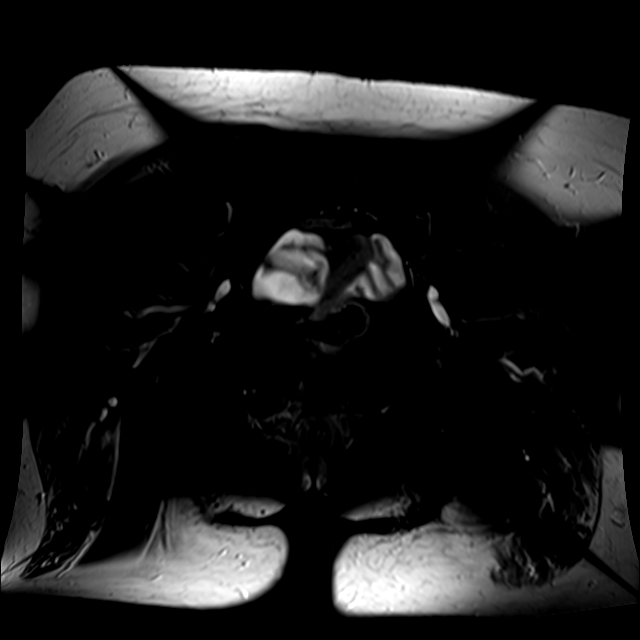
[im 8/24]
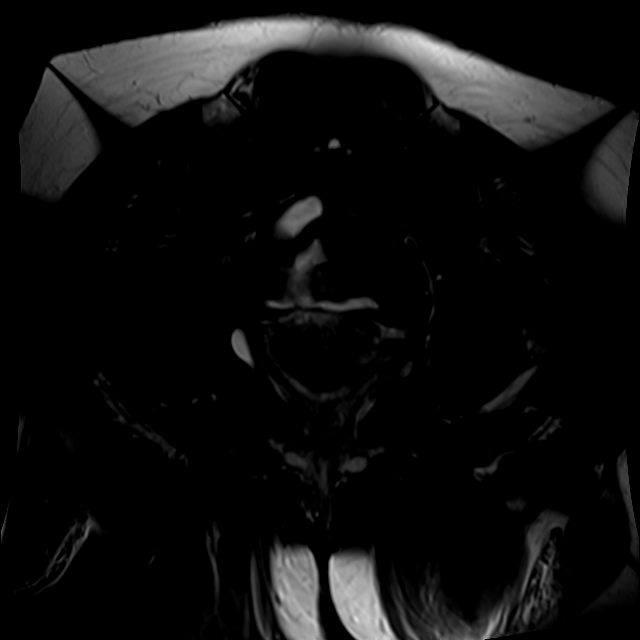
[im 16/24]
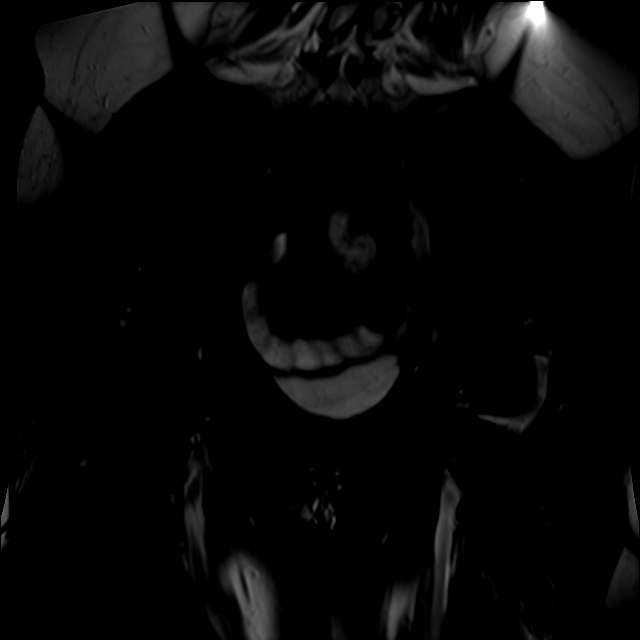
[im 24/24]
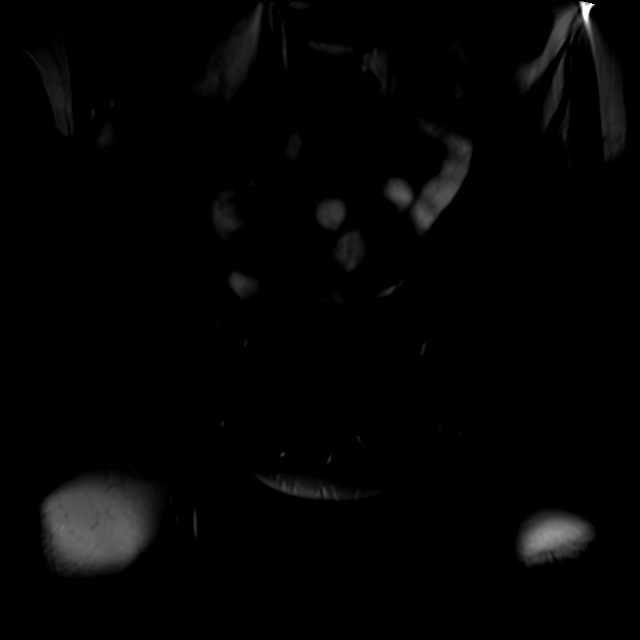

[Series 5: T2 fat-sat · axial · 4.0mm · 0.70mm/px · z∈[-94,+116]mm · 5 of 49 slices shown (2 of 2)]
[im 1/49]
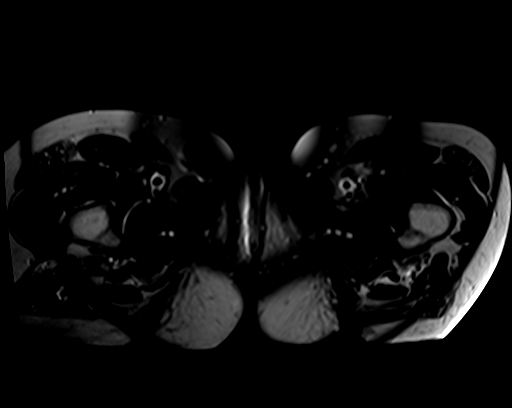
[im 7/49]
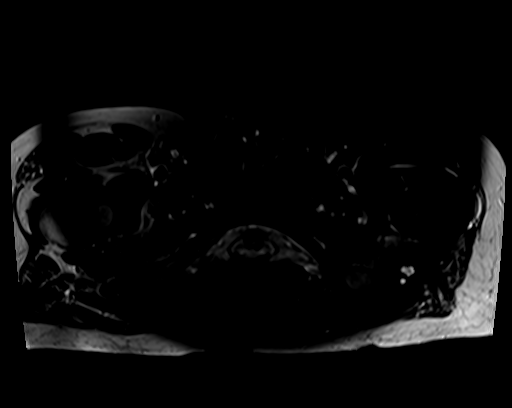
[im 13/49]
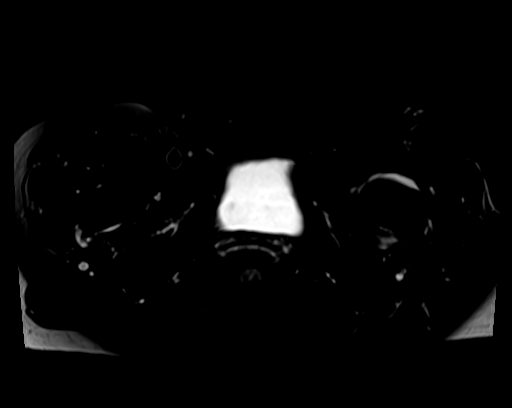
[im 25/49]
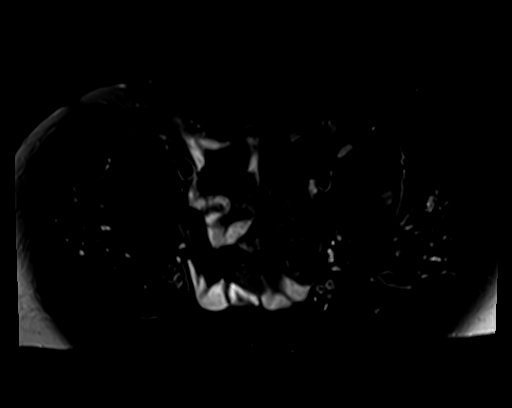
[im 43/49]
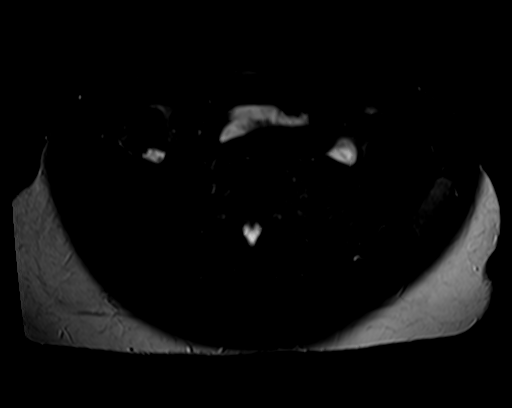

[Series 6: STIR · coronal · 4.0mm · 0.66mm/px · 3 of 23 slices shown]
[im 1/23]
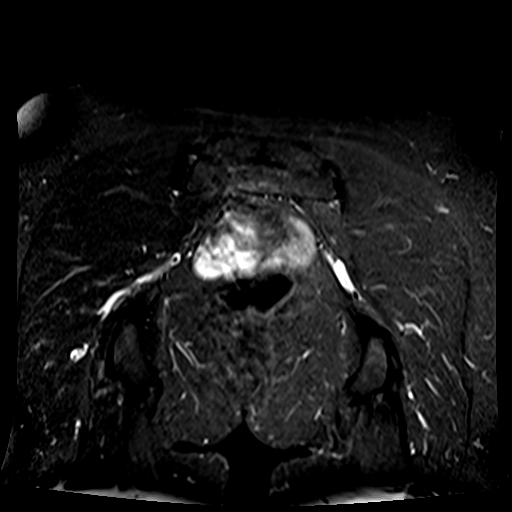
[im 15/23]
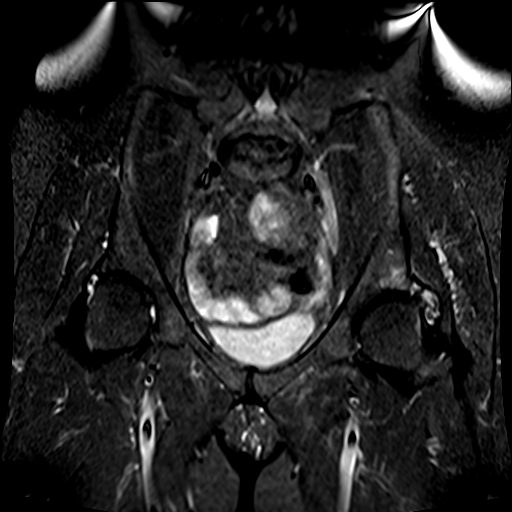
[im 23/23]
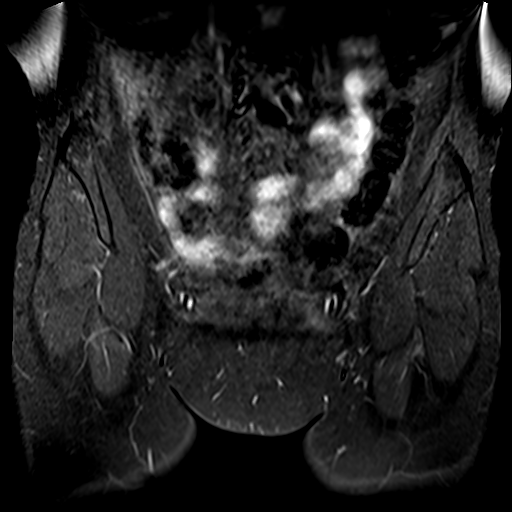

[16 of 40 positions shown; findings below may reference images not displayed]

FINDINGS: Bones: There is subchondral edema in the left acetabulum. No
fracture or worrisome lesion is identified. No avascular necrosis of
the femoral heads.

Articular cartilage and labrum

Articular cartilage: Moderate to moderately severe cartilage
thinning about the left hip is identified. Mild appearing cartilage
thinning of the right hip is incompletely assessed on this exam.

Labrum: There is marked degenerative tearing of the left anterior
and superior labrum. An associated debris containing paralabral cyst
along the periphery of the superior left acetabulum measures
approximately 2.5 cm craniocaudal by 4.5 cm AP by 2 cm transverse. A
small cystic lesion along the anterior aspect of the right
acetabulum measuring approximately 1 cm craniocaudal by 1.2 cm AP by
0.6 cm transverse is worrisome for paralabral cyst.

Joint or bursal effusion

Joint effusion: The left hip is distended with contrast. No right
hip effusion.

Bursae: Negative.

Muscles and tendons

Muscles and tendons: Intact and normal in appearance. No finding to
suggest piriformis syndrome.

Other findings

Miscellaneous:   None.
IMPRESSION: Moderate to moderately severe appearing left hip osteoarthritis with
associated tearing of the left anterior and superior labrum. Debris
containing paralabral cyst along the periphery of the acetabular
roof on the left is again noted.

Incomplete visualization of mild appearing right hip osteoarthritis
with a likely small paralabral cyst indicative of a labral tear.

## 2021-09-14 ENCOUNTER — Ambulatory Visit (INDEPENDENT_AMBULATORY_CARE_PROVIDER_SITE_OTHER): Payer: Medicare Other | Admitting: Family Medicine

## 2021-09-14 ENCOUNTER — Other Ambulatory Visit: Payer: Self-pay

## 2021-09-14 VITALS — BP 122/70 | HR 95 | Temp 97.9°F | Wt 150.6 lb

## 2021-09-14 DIAGNOSIS — Z23 Encounter for immunization: Secondary | ICD-10-CM

## 2021-09-14 DIAGNOSIS — M1612 Unilateral primary osteoarthritis, left hip: Secondary | ICD-10-CM | POA: Diagnosis not present

## 2021-09-14 DIAGNOSIS — Z01812 Encounter for preprocedural laboratory examination: Secondary | ICD-10-CM

## 2021-09-14 DIAGNOSIS — Z01818 Encounter for other preprocedural examination: Secondary | ICD-10-CM

## 2021-09-14 DIAGNOSIS — M25552 Pain in left hip: Secondary | ICD-10-CM | POA: Diagnosis not present

## 2021-09-14 NOTE — Progress Notes (Signed)
Chief Complaint  Patient presents with   surgical clearance.     HPI:  Patient is seen for optimization of general medical care prior to surgery. Surgery type: L THR Date of surgery: 10/05/21  Kidney disease? No  Prior surgeries/Issues following anesthesia? no Hx MI, heart arrythmia, CHF, angina or stroke? None  Epilepsy or Seizures? None  Arthritis or problems with neck or jaw? None   Thyroid disease? None  Liver disease? None  Asthma, COPD or chronic lung disease? None  Diabetes? None   Other: Poor nutrition, Frail or other: no  METS:  ?Can take care of self, such as eat, dress, or use the toilet (1 MET). yes ?Can walk up a flight of steps or a hill (4 METs).yes ?Can do heavy work around the house such as scrubbing floors or lifting or moving heavy furniture (between 4 and 10 METs). yes ?Can participate in strenuous sports such as swimming, singles tennis, football, basketball, and skiing (>10 METs) . AHA Risks: Major predictors that require intensive management and may lead to delay in or cancellation of the operative procedure unless emergent: NONE   Unstable coronary syndromes including unstable or severe angina or recent MI   Decompensated heart failure including NYHA functional class IV or worsening or new-onset HF   Significant arrhythmias including high grade AV block, symptomatic ventricular arrhythmias, supraventricular arrhythmias with ventricular rate >100 bpm at rest, symptomatic bradycardia, and newly recognized ventricular tachycardia   Severe heart valve disease including severe aortic stenosis or symptomatic mitral stenosis   Other clinical predictors that warrant careful assessment of current status: NONE   History of ischemic heart disease  History of cerebrovascular disease   History of compensated heart failure or prior heart failure   Diabetes mellitus   Renal insufficiency  Type of surgery and Risk: 1) High risk (reported risk of cardiac death or  nonfatal myocardial infarction [MI] often greater than 5 percent):   Aortic and other major vascular surgery   Peripheral artery surgery   2)Intermediate risk (reported risk of cardiac death or nonfatal MI generally 1 to 5 percent):   Carotid endarterectomy   Head and neck surgery   Intraperitoneal and intrathoracic surgery   Orthopedic surgery   Prostate surgery   3)Low risk (reported risk of cardiac death or nonfatal MI generally less than 1 percent):   Ambulatory surgery   Endoscopic procedures   Superficial procedure   Cataract surgery   Breast surgery  Medications that need to be addressed prior to surgery: None Discontinue acei/arbs/non-statin lipid lowering drugs day of surgery ASA stop 7 days before or discuss with cardiology if CV risks, other anticoagulants discuss with cardiology.  ROS: See pertinent positives and negatives per HPI. 11 point ROS negative except where noted.  Past Medical History:  Diagnosis Date   Cancer ([place])    Hypertension     Past Surgical History:  Procedure Laterality Date   TONSILLECTOMY      Family History  Problem Relation Age of Onset   Breast cancer Mother        pagets disease of nipple    Social History   Socioeconomic History   Marital status: Married    Spouse name: Not on file   Number of children: Not on file   Years of education: Not on file   Highest education level: Not on file  Occupational History   Not on file  Tobacco Use   Smoking status: Former    Types: Cigarettes  Start date: 38    Quit date: 5    Years since quitting: 42.8   Smokeless tobacco: Never  Substance and Sexual Activity   Alcohol use: Yes   Drug use: Never   Sexual activity: Not on file  Other Topics Concern   Not on file  Social History Narrative   Not on file   Social Determinants of Health   Financial Resource Strain: Not on file  Food Insecurity: Not on file  Transportation Needs: Not on file  Physical Activity: Not on  file  Stress: Not on file  Social Connections: Not on file    Current Outpatient Medications:    Calcium Carb-Cholecalciferol (CALCIUM 600-D PO), Take 600 mg by mouth daily., Disp: , Rfl:    lisinopril-hydrochlorothiazide (ZESTORETIC) 10-12.5 MG tablet, Take 1 tablet by mouth daily., Disp: 90 tablet, Rfl: 3   Multiple Vitamin (MULTIVITAMINS PO), Take by mouth., Disp: , Rfl:   EXAM:  Vitals:   09/14/21 1417  BP: 122/70  Pulse: 95  Temp: 97.9 F (36.6 C)  SpO2: 99%    Body mass index is 22.24 kg/m.  GENERAL: vitals reviewed and listed above, alert, oriented, appears well hydrated and in no acute distress  HEENT: atraumatic, conjunttiva clear, no obvious abnormalities on inspection of external nose and ears  NECK: no obvious masses on inspection, no carotid bruits  LUNGS: clear to auscultation bilaterally, no wheezes, rales or rhonchi, good air movement  CV: HRRR, no peripheral edema, no JVD, BP normal range, normal radial pulses  MS: moves all extremities without noticeable abnormality  PSYCH: pleasant and cooperative, no obvious depression or anxiety  ASSESSMENT AND PLAN:  Discussed the following assessment and plan:  Arthritis of left hip  Left hip pain -L hip pain 2/2 arthritis.  L THR planned with Ortho.  Continue current meds.  Will obtain pre op labs.  Will complete surgery optimization form when lab results available.  Preop examination  - Plan: CBC with Differential/Platelet, Basic metabolic panel  Pre-operative laboratory examination  - Plan: CBC with Differential/Platelet, Basic metabolic panel, PT/INR  Need for immunization against influenza  - Plan: Flu Vaccine QUAD High Dose(Fluad)  Assessment: -Risk factors: none -Surgery Risks:intermediate -age, nutritional status, fraility: good nutritional status, age >71, no fraility -functional capacity: > 4 METs wethout symptoms -comorbidities: none Patient Specific Risks: patient is low risk for  intermediate risks surgery  Recommendations for optimizing general medical care prior to surgery: -advised patient to discuss specific risks morbidity and mortality of surgery with surgeon, CV risks discussed with patient -advised patient will defer to surgeon for post-op DVT prophylaxis and post op care -no specific medical recommendations for this patient at this time and no recommendations to defer surgery or for further CV testing prior to surgery -form for pre-op optimization of general medical care prior to surgery faxed to surgeon office  -Patient advised to return or notify a doctor immediately if symptoms worsen or persist or new concerns arise.   Billie Ruddy

## 2021-09-15 LAB — PROTIME-INR
INR: 1 ratio (ref 0.8–1.0)
Prothrombin Time: 11 s (ref 9.6–13.1)

## 2021-09-15 LAB — CBC WITH DIFFERENTIAL/PLATELET
Basophils Absolute: 0 10*3/uL (ref 0.0–0.1)
Basophils Relative: 0.7 % (ref 0.0–3.0)
Eosinophils Absolute: 0.1 10*3/uL (ref 0.0–0.7)
Eosinophils Relative: 0.9 % (ref 0.0–5.0)
HCT: 38.9 % (ref 36.0–46.0)
Hemoglobin: 13.2 g/dL (ref 12.0–15.0)
Lymphocytes Relative: 35.5 % (ref 12.0–46.0)
Lymphs Abs: 2.7 10*3/uL (ref 0.7–4.0)
MCHC: 33.9 g/dL (ref 30.0–36.0)
MCV: 94.1 fl (ref 78.0–100.0)
Monocytes Absolute: 0.6 10*3/uL (ref 0.1–1.0)
Monocytes Relative: 8.6 % (ref 3.0–12.0)
Neutro Abs: 4.1 10*3/uL (ref 1.4–7.7)
Neutrophils Relative %: 54.3 % (ref 43.0–77.0)
Platelets: 317 10*3/uL (ref 150.0–400.0)
RBC: 4.13 Mil/uL (ref 3.87–5.11)
RDW: 12.2 % (ref 11.5–15.5)
WBC: 7.5 10*3/uL (ref 4.0–10.5)

## 2021-09-15 LAB — BASIC METABOLIC PANEL
BUN: 11 mg/dL (ref 6–23)
CO2: 27 mEq/L (ref 19–32)
Calcium: 9.7 mg/dL (ref 8.4–10.5)
Chloride: 93 mEq/L — ABNORMAL LOW (ref 96–112)
Creatinine, Ser: 0.87 mg/dL (ref 0.40–1.20)
GFR: 69.65 mL/min (ref >60.00)
Glucose, Bld: 98 mg/dL (ref 70–99)
Potassium: 3.9 mEq/L (ref 3.5–5.1)
Sodium: 130 mEq/L — ABNORMAL LOW (ref 135–145)

## 2021-09-18 ENCOUNTER — Encounter: Payer: Self-pay | Admitting: Family Medicine

## 2021-09-18 ENCOUNTER — Telehealth: Payer: Self-pay | Admitting: Family Medicine

## 2021-09-18 NOTE — Telephone Encounter (Signed)
Wells Guiles from Dr Rhona Raider office called in regards to Surgical Clearance letter that was faxed over.. Called to find out status

## 2021-09-18 NOTE — Telephone Encounter (Signed)
Sherry Harrison, no answer. Left VM informing her that we did get the Surgery Clearance letter, ad that patient had labs that needed to be repeated this week.

## 2021-09-25 ENCOUNTER — Other Ambulatory Visit: Payer: Self-pay

## 2021-09-25 ENCOUNTER — Other Ambulatory Visit (INDEPENDENT_AMBULATORY_CARE_PROVIDER_SITE_OTHER): Payer: Medicare Other

## 2021-09-25 DIAGNOSIS — Z01812 Encounter for preprocedural laboratory examination: Secondary | ICD-10-CM

## 2021-09-26 ENCOUNTER — Encounter: Payer: Self-pay | Admitting: Family Medicine

## 2021-09-26 LAB — BASIC METABOLIC PANEL
BUN: 10 mg/dL (ref 6–23)
CO2: 30 mEq/L (ref 19–32)
Calcium: 9.4 mg/dL (ref 8.4–10.5)
Chloride: 94 mEq/L — ABNORMAL LOW (ref 96–112)
Creatinine, Ser: 0.83 mg/dL (ref 0.40–1.20)
GFR: 73.68 mL/min (ref >60.00)
Glucose, Bld: 90 mg/dL (ref 70–99)
Potassium: 4.4 mEq/L (ref 3.5–5.1)
Sodium: 131 mEq/L — ABNORMAL LOW (ref 135–145)

## 2021-09-29 NOTE — Telephone Encounter (Signed)
Clearance forms, office note and labs faxed. Fax confirmation rec'd.

## 2021-09-29 NOTE — Telephone Encounter (Signed)
Sherry Harrison is calling checking on the status of surgery clearance letter and the fax number [phone] please fax copy of labs and office notes

## 2021-11-22 ENCOUNTER — Encounter: Payer: Self-pay | Admitting: Family Medicine

## 2021-12-27 ENCOUNTER — Other Ambulatory Visit: Payer: Self-pay | Admitting: Family Medicine

## 2021-12-27 DIAGNOSIS — Z1231 Encounter for screening mammogram for malignant neoplasm of breast: Secondary | ICD-10-CM

## 2022-01-09 ENCOUNTER — Other Ambulatory Visit: Payer: Self-pay

## 2022-01-09 ENCOUNTER — Ambulatory Visit
Admission: RE | Admit: 2022-01-09 | Discharge: 2022-01-09 | Disposition: A | Discharge status: Home / Self Care | Payer: Medicare Other | Source: Ambulatory Visit | Attending: Family Medicine | Admitting: Family Medicine

## 2022-01-09 DIAGNOSIS — Z1231 Encounter for screening mammogram for malignant neoplasm of breast: Secondary | ICD-10-CM

## 2022-04-25 ENCOUNTER — Telehealth: Payer: Self-pay | Admitting: Family Medicine

## 2022-04-25 NOTE — Telephone Encounter (Signed)
Spoke to patient to schedule Medicare Annual Wellness Visit (AWV) either virtually or in office. Left  my Herbie Drape number [phone]  She stated she would call back    Last WTM 04/24/21 ;please schedule at anytime with [facility] Nurse Health Advisor 1 or 2   This should be a 45 minute visit.

## 2022-05-12 ENCOUNTER — Other Ambulatory Visit: Payer: Self-pay | Admitting: Family Medicine

## 2022-05-12 DIAGNOSIS — I10 Essential (primary) hypertension: Secondary | ICD-10-CM

## 2022-05-24 ENCOUNTER — Ambulatory Visit (INDEPENDENT_AMBULATORY_CARE_PROVIDER_SITE_OTHER): Payer: Medicare Other

## 2022-05-24 VITALS — BP 120/62 | HR 74 | Temp 98.0°F | Ht 68.0 in | Wt 156.3 lb

## 2022-05-24 DIAGNOSIS — Z0000 Encounter for general adult medical examination without abnormal findings: Secondary | ICD-10-CM | POA: Diagnosis not present

## 2022-05-24 NOTE — Progress Notes (Cosign Needed)
Subjective:   Sherry Harrison is a 66 y.o. female who presents for Medicare Annual (Subsequent) preventive examination.  Review of Systems     Cardiac Risk Factors include: advanced age (>30mn, >>73women);hypertension     Objective:    Today's Vitals   05/24/22 0946  BP: 120/62  Pulse: 74  Temp: 98 F (36.7 C)  TempSrc: Oral  SpO2: 97%  Weight: 156 lb 4.8 oz (70.9 kg)  Height: '5\' 8"'$  (1.727 m)   Body mass index is 23.77 kg/m.     05/24/2022   10:07 AM  Advanced Directives  Does Patient Have a Medical Advance Directive? No  Would patient like information on creating a medical advance directive? No - Patient declined    Current Medications (verified) Outpatient Encounter Medications as of 05/24/2022  Medication Sig   Calcium Carb-Cholecalciferol (CALCIUM 600-D PO) Take 600 mg by mouth daily.   lisinopril-hydrochlorothiazide (ZESTORETIC) 10-12.5 MG tablet TAKE 1 TABLET BY MOUTH EVERY DAY   Multiple Vitamin (MULTIVITAMINS PO) Take by mouth.   No facility-administered encounter medications on file as of 05/24/2022.    Allergies (verified) Patient has no known allergies.   History: Past Medical History:  Diagnosis Date   [place]    Hypertension    Past Surgical History:  Procedure Laterality Date   HIP SURGERY     HIP SURGERY Left    TONSILLECTOMY     Family History  Problem Relation Age of Onset   Breast cancer Mother        pagets disease of nipple   Social History   Socioeconomic History   Marital status: Married    Spouse name: Not on file   Number of children: Not on file   Years of education: Not on file   Highest education level: Not on file  Occupational History   Not on file  Tobacco Use   Smoking status: Former    Types: Cigarettes    Start date: 199   Quit date: 172   Years since quitting: 43.5   Smokeless tobacco: Never  Substance and Sexual Activity   Alcohol use: Yes   Drug use: Never   Sexual activity: Not on file   Other Topics Concern   Not on file  Social History Narrative   Not on file   Social Determinants of Health   Financial Resource Strain: Low Risk  (05/24/2022)   Overall Financial Resource Strain (CARDIA)    Difficulty of Paying Living Expenses: Not hard at all  Food Insecurity: No Food Insecurity (05/24/2022)   Hunger Vital Sign    Worried About Running Out of Food in the Last Year: Never true    [place] the Last Year: Never true  Transportation Needs: No Transportation Needs (05/24/2022)   PRAPARE - THydrologist(Medical): No    Lack of Transportation (Non-Medical): No  Physical Activity: Sufficiently Active (05/24/2022)   Exercise Vital Sign    Days of Exercise per Week: 4 days    Minutes of Exercise per Session: 130 min  Stress: No Stress Concern Present (05/24/2022)   [place]   Feeling of Stress : Not at all  Social Connections: Moderately Integrated (05/24/2022)   Social Connection and Isolation Panel [NHANES]    Frequency of Communication with Friends and Family: More than three times a week    Frequency of Social Gatherings with Friends  and Family: More than three times a week    Attends Religious Services: Never    Active Member of Clubs or Organizations: Yes    Attends Music therapist: More than 4 times per year    Marital Status: Married    Tobacco Counseling Counseling given: Not Answered   Clinical Intake:  Pre-visit preparation completed: No Diabetic?  No   Activities of Daily Living    05/24/2022   10:04 AM  In your present state of health, do you have any difficulty performing the following activities:  Hearing? 0  Vision? 0  Difficulty concentrating or making decisions? 0  Walking or climbing stairs? 0  Dressing or bathing? 0  Doing errands, shopping? 0  Preparing Food and eating ? N  Using the Toilet? N  In the past six  months, have you accidently leaked urine? N  Do you have problems with loss of bowel control? N  Managing your Medications? N  Managing your Finances? N  Housekeeping or managing your Housekeeping? N    Patient Care Team: Billie Ruddy, MD as PCP - General (Family Medicine)  Indicate any recent Medical Services you may have received from other than [facility] providers in the past year (date may be approximate).     Assessment:   This is a routine wellness examination for [place].  Hearing/Vision screen Hearing Screening - Comments:: No hearing difficulty Vision Screening - Comments:: Wears glasses. Followed by [place] issues and exercise activities discussed: Exercise limited by: None identified   Goals Addressed               This Visit's Progress     Patient stated (pt-stated)        I want to travel more.       Depression Screen    05/24/2022   10:02 AM 04/24/2021   10:31 AM  PHQ 2/9 Scores  PHQ - 2 Score 0 0    Fall Risk    05/24/2022   10:06 AM 04/24/2021    9:34 AM  [place] in the past year? 0 0  Number falls in past yr: 0   Injury with Fall? 0   Risk for fall due to : No Fall Risks     FALL RISK PREVENTION PERTAINING TO THE HOME:  Any stairs in or around the home? Yes  If so, are there any without handrails? No  Home free of loose throw rugs in walkways, pet beds, electrical cords, etc? Yes  Adequate lighting in your home to reduce risk of falls? Yes   ASSISTIVE DEVICES UTILIZED TO PREVENT FALLS:  Life alert? No  Use of a cane, walker or w/c? No  Grab bars in the bathroom? No  Shower chair or bench in shower? No  Elevated toilet seat or a handicapped toilet? Yes   TIMED UP AND GO:  Was the test performed? Yes .  Length of time to ambulate 10 feet: 5 sec.   Gait steady and fast without use of assistive device  Cognitive Function:        05/24/2022   10:07 AM  6CIT Screen  What Year? 0 points  What month? 0  points  What time? 0 points  Count back from 20 0 points  Months in reverse 0 points  Repeat phrase 0 points  Total Score 0 points    Immunizations Immunization History  Administered Date(s) Administered   Fluad Quad(high Dose 65+)  09/14/2021   Influenza Inj Mdck Quad Pf 09/19/2016   Influenza,inj,Quad PF,6+ Mos 09/18/2018, 08/25/2019   Influenza,inj,quad, With Preservative 08/20/2017   Influenza-Unspecified 09/03/2015   PFIZER(Purple Top)SARS-COV-2 Vaccination 02/15/2020, 03/17/2020, 10/03/2020, 12/14/2020, 03/03/2021   PNEUMOCOCCAL CONJUGATE-20 04/24/2021   Tdap 11/02/2014   Zoster Recombinat (Shingrix) 09/09/2019, 02/12/2020   Zoster, Live 02/01/2016    TDAP status: Up to date  Flu Vaccine status: Up to date  Pneumococcal vaccine status: Up to date  Covid-19 vaccine status: Completed vaccines  Qualifies for Shingles Vaccine? Yes   Zostavax completed Yes   Shingrix Completed?: Yes  Screening Tests Health Maintenance  Topic Date Due   COVID-19 Vaccine (6 - Pfizer series) 04/28/2021   INFLUENZA VACCINE  07/03/2022   MAMMOGRAM  01/10/2024   TETANUS/TDAP  11/16/2024   COLONOSCOPY (Pts 45-69yr Insurance coverage will need to be confirmed)  03/23/2026   Pneumonia Vaccine 67 Years old  Completed   DEXA SCAN  Completed   Hepatitis C Screening  Completed   Zoster Vaccines- Shingrix  Completed   HPV VACCINES  Aged Out    Health Maintenance  Health Maintenance Due  Topic Date Due   COVID-19 Vaccine (6 - Pfizer series) 04/28/2021    Colorectal cancer screening: Type of screening: Colonoscopy. Completed 03/23/16. Repeat every 10 years  Mammogram status: Completed 01/09/22. Repeat every year  Bone Density status: Completed 05/05/21. Results reflect: Bone density results: OSTEOPOROSIS. Repeat every 2 years.  Lung Cancer Screening: (Low Dose CT Chest recommended if Age 67-80years, 30 pack-year currently smoking OR have quit w/in 15years.) does not qualify.     Additional Screening:  Hepatitis C Screening: does qualify; Completed 04/24/21  Vision Screening: Recommended annual ophthalmology exams for early detection of glaucoma and other disorders of the eye. Is the patient up to date with their annual eye exam?  Yes  Who is the provider or what is the name of the office in which the patient attends annual eye exams? T[facility]If pt is not established with a provider, would they like to be referred to a provider to establish care? No .   Dental Screening: Recommended annual dental exams for proper oral hygiene  Community Resource Referral / Chronic Care Management:   CRR required this visit?  No   CCM required this visit?  No      Plan:     I have personally reviewed and noted the following in the patient's chart:   Medical and social history Use of alcohol, tobacco or illicit drugs  Current medications and supplements including opioid prescriptions.  Functional ability and status Nutritional status Physical activity Advanced directives List of other physicians Hospitalizations, surgeries, and ER visits in previous 12 months Vitals Screenings to include cognitive, depression, and falls Referrals and appointments  In addition, I have reviewed and discussed with patient certain preventive protocols, quality metrics, and best practice recommendations. A written personalized care plan for preventive services as well as general preventive health recommendations were provided to patient.     BCriselda Peaches LPN   [id number]  Nurse Notes: Patient request f/u with questions about future Pap Smears

## 2022-08-27 ENCOUNTER — Encounter (HOSPITAL_BASED_OUTPATIENT_CLINIC_OR_DEPARTMENT_OTHER): Payer: Self-pay

## 2022-08-27 ENCOUNTER — Emergency Department (HOSPITAL_BASED_OUTPATIENT_CLINIC_OR_DEPARTMENT_OTHER): Payer: Medicare Other | Admitting: Radiology

## 2022-08-27 ENCOUNTER — Encounter (HOSPITAL_COMMUNITY): Payer: Self-pay

## 2022-08-27 ENCOUNTER — Other Ambulatory Visit: Payer: Self-pay | Admitting: Orthopaedic Surgery

## 2022-08-27 ENCOUNTER — Other Ambulatory Visit: Payer: Self-pay

## 2022-08-27 ENCOUNTER — Inpatient Hospital Stay (HOSPITAL_BASED_OUTPATIENT_CLINIC_OR_DEPARTMENT_OTHER)
Admission: EM | Admit: 2022-08-27 | Discharge: 2022-08-29 | DRG: 522 | Disposition: A | Discharge status: Home / Self Care | Payer: Medicare Other | Attending: Internal Medicine | Admitting: Internal Medicine

## 2022-08-27 ENCOUNTER — Emergency Department (HOSPITAL_BASED_OUTPATIENT_CLINIC_OR_DEPARTMENT_OTHER): Payer: Medicare Other

## 2022-08-27 DIAGNOSIS — Z79899 Other long term (current) drug therapy: Secondary | ICD-10-CM | POA: Diagnosis not present

## 2022-08-27 DIAGNOSIS — S72001A Fracture of unspecified part of neck of right femur, initial encounter for closed fracture: Secondary | ICD-10-CM | POA: Diagnosis present

## 2022-08-27 DIAGNOSIS — Z803 Family history of malignant neoplasm of breast: Secondary | ICD-10-CM

## 2022-08-27 DIAGNOSIS — Z87891 Personal history of nicotine dependence: Secondary | ICD-10-CM | POA: Diagnosis not present

## 2022-08-27 DIAGNOSIS — Y9373 Activity, racquet and hand sports: Secondary | ICD-10-CM | POA: Diagnosis not present

## 2022-08-27 DIAGNOSIS — G8929 Other chronic pain: Secondary | ICD-10-CM | POA: Diagnosis present

## 2022-08-27 DIAGNOSIS — M810 Age-related osteoporosis without current pathological fracture: Secondary | ICD-10-CM | POA: Diagnosis present

## 2022-08-27 DIAGNOSIS — E871 Hypo-osmolality and hyponatremia: Secondary | ICD-10-CM | POA: Diagnosis present

## 2022-08-27 DIAGNOSIS — Y92312 Tennis court as the place of occurrence of the external cause: Secondary | ICD-10-CM

## 2022-08-27 DIAGNOSIS — I10 Essential (primary) hypertension: Secondary | ICD-10-CM | POA: Diagnosis present

## 2022-08-27 DIAGNOSIS — W010XXA Fall on same level from slipping, tripping and stumbling without subsequent striking against object, initial encounter: Secondary | ICD-10-CM | POA: Diagnosis present

## 2022-08-27 DIAGNOSIS — M48062 Spinal stenosis, lumbar region with neurogenic claudication: Secondary | ICD-10-CM | POA: Diagnosis present

## 2022-08-27 DIAGNOSIS — S62524A Nondisplaced fracture of distal phalanx of right thumb, initial encounter for closed fracture: Secondary | ICD-10-CM | POA: Diagnosis present

## 2022-08-27 DIAGNOSIS — Z888 Allergy status to other drugs, medicaments and biological substances status: Secondary | ICD-10-CM | POA: Diagnosis not present

## 2022-08-27 DIAGNOSIS — Z96642 Presence of left artificial hip joint: Secondary | ICD-10-CM | POA: Diagnosis present

## 2022-08-27 DIAGNOSIS — Z885 Allergy status to narcotic agent status: Secondary | ICD-10-CM

## 2022-08-27 DIAGNOSIS — M545 Low back pain, unspecified: Secondary | ICD-10-CM | POA: Diagnosis present

## 2022-08-27 LAB — CBC WITH DIFFERENTIAL/PLATELET
Abs Immature Granulocytes: 0.05 10*3/uL (ref 0.00–0.07)
Basophils Absolute: 0 10*3/uL (ref 0.0–0.1)
Basophils Relative: 0 %
Eosinophils Absolute: 0 10*3/uL (ref 0.0–0.5)
Eosinophils Relative: 0 %
HCT: 38.2 % (ref 36.0–46.0)
Hemoglobin: 13.6 g/dL (ref 12.0–15.0)
Immature Granulocytes: 0 %
Lymphocytes Relative: 11 %
Lymphs Abs: 1.5 10*3/uL (ref 0.7–4.0)
MCH: 31.9 pg (ref 26.0–34.0)
MCHC: 35.6 g/dL (ref 30.0–36.0)
MCV: 89.7 fL (ref 80.0–100.0)
Monocytes Absolute: 0.8 10*3/uL (ref 0.1–1.0)
Monocytes Relative: 5 %
Neutro Abs: 11.9 10*3/uL — ABNORMAL HIGH (ref 1.7–7.7)
Neutrophils Relative %: 84 %
Platelets: 304 10*3/uL (ref 150–400)
RBC: 4.26 MIL/uL (ref 3.87–5.11)
RDW: 11.5 % (ref 11.5–15.5)
WBC: 14.3 10*3/uL — ABNORMAL HIGH (ref 4.0–10.5)
nRBC: 0 % (ref 0.0–0.2)

## 2022-08-27 LAB — BASIC METABOLIC PANEL
Anion gap: 12 (ref 5–15)
Anion gap: 7 (ref 5–15)
BUN: 12 mg/dL (ref 8–23)
BUN: 13 mg/dL (ref 8–23)
CO2: 25 mmol/L (ref 22–32)
CO2: 25 mmol/L (ref 22–32)
Calcium: 9.5 mg/dL (ref 8.9–10.3)
Calcium: 9.2 mg/dL (ref 8.9–10.3)
Chloride: 88 mmol/L — ABNORMAL LOW (ref 98–111)
Chloride: 98 mmol/L (ref 98–111)
Creatinine, Ser: 0.86 mg/dL (ref 0.44–1.00)
Creatinine, Ser: 0.89 mg/dL (ref 0.44–1.00)
GFR, Estimated: >60 mL/min (ref >60)
GFR, Estimated: >60 mL/min (ref >60)
Glucose, Bld: 121 mg/dL — ABNORMAL HIGH (ref 70–99)
Glucose, Bld: 147 mg/dL — ABNORMAL HIGH (ref 70–99)
Potassium: 3.5 mmol/L (ref 3.5–5.1)
Potassium: 3.9 mmol/L (ref 3.5–5.1)
Sodium: 125 mmol/L — ABNORMAL LOW (ref 135–145)
Sodium: 130 mmol/L — ABNORMAL LOW (ref 135–145)

## 2022-08-27 MED ORDER — FENTANYL CITRATE PF 50 MCG/ML IJ SOSY
50.0000 ug | PREFILLED_SYRINGE | Freq: Once | INTRAMUSCULAR | Status: AC | PRN
  Administered 2022-08-27: 50 ug via INTRAVENOUS
  Filled 2022-08-27: qty 1

## 2022-08-27 MED ORDER — ONDANSETRON HCL 4 MG/2ML IJ SOLN
4.0000 mg | Freq: Once | INTRAMUSCULAR | Status: AC | PRN
  Administered 2022-08-27: 4 mg via INTRAVENOUS
  Filled 2022-08-27: qty 2

## 2022-08-27 MED ORDER — HYDROCODONE-ACETAMINOPHEN 5-325 MG PO TABS: 1.0000 | ORAL_TABLET | Freq: Four times a day (QID) | ORAL | Status: DC | PRN

## 2022-08-27 MED ORDER — POLYETHYLENE GLYCOL 3350 17 G PO PACK: 17.0000 g | PACK | Freq: Every day | ORAL | Status: DC | PRN

## 2022-08-27 MED ORDER — LISINOPRIL 10 MG PO TABS: 10.0000 mg | ORAL_TABLET | Freq: Every day | ORAL | Status: DC

## 2022-08-27 MED ORDER — ONDANSETRON HCL 4 MG/2ML IJ SOLN
4.0000 mg | Freq: Four times a day (QID) | INTRAMUSCULAR | Status: AC | PRN
  Administered 2022-08-27: 4 mg via INTRAVENOUS
  Filled 2022-08-27: qty 2

## 2022-08-27 MED ORDER — SODIUM CHLORIDE 0.9 % IV BOLUS
1000.0000 mL | Freq: Once | INTRAVENOUS | Status: AC
  Administered 2022-08-27: 1000 mL via INTRAVENOUS

## 2022-08-27 MED ORDER — HYDROMORPHONE HCL 1 MG/ML IJ SOLN
0.5000 mg | INTRAMUSCULAR | Status: DC | PRN
  Administered 2022-08-27 – 2022-08-28 (×4): 0.5 mg via INTRAVENOUS
  Filled 2022-08-27 (×4): qty 0.5

## 2022-08-27 NOTE — ED Triage Notes (Signed)
Patient BIB [facility] from [place].  Endorses Playing Tennis earlier when while serving she fell. She hit her Posterior Head on the Ground and endorsing Main Complaint is to Right Hip.   No LOC. No Anticoagulants.   NAD Noted during Triage. A&Ox4. GCS 15. BIB Wheelchair

## 2022-08-27 NOTE — H&P (Signed)
History and Physical   Sherry Harrison VEH:[id number] DOB: [birth year] DOA: 08/27/2022  PCP: Billie Ruddy, MD   Patient coming from: Home  Chief Complaint: Fall  HPI: Sherry Harrison is a 66 y.o. female with medical history significant of spinal stenosis, chronic pain, hypertension, cancer, osteoporosis presenting after a fall.  Patient reportedly had a fall while playing tennis earlier today.  She stumbled when she was going to return a ball and landed on her right side.  Had hip pain following this fall.  She did hit her head but denies any loss of consciousness.  Has history of prior left hip surgery and osteoporosis.  She denies fevers, chills, chest pain, shortness of breath, abdominal pain, constipation, diarrhea, nausea, vomiting.  ED Course: Signs in the ED significant for blood pressure in the 836O to 294T systolic and heart rate in the 80s to 100s.  Lab work-up included BMP with sodium 125, chloride 88, glucose 121.  CBC with leukocytosis to 14.3.  Patient received fentanyl, Zofran, liter fluids in the ED.  Hip x-ray showed mild displaced right femoral neck fracture, right hand x-ray showed minimally displaced first distal phalangeal fracture, right knee x-ray showed no acute normality, CT head showed no acute abnormality.  Orthopedics consulted and plan for OR tomorrow with n.p.o. at midnight.  Review of Systems: As per HPI otherwise all other systems reviewed and are negative.  Past Medical History:  Diagnosis Date   Cancer ([place])    Hypertension     Past Surgical History:  Procedure Laterality Date   HIP SURGERY     HIP SURGERY Left    TONSILLECTOMY      Social History  reports that she quit smoking about 43 years ago. Her smoking use included cigarettes. She started smoking about 48 years ago. She has never used smokeless tobacco. She reports current alcohol use. She reports that she does not use drugs.  No Known Allergies  Family History  Problem Relation Age  of Onset   Breast cancer Mother        pagets disease of nipple  Reviewed on admission  Prior to Admission medications   Medication Sig Start Date End Date Taking? Authorizing Provider  Calcium Carb-Cholecalciferol (CALCIUM 600-D PO) Take 600 mg by mouth daily.    [provider]  lisinopril-hydrochlorothiazide (ZESTORETIC) 10-12.5 MG tablet TAKE 1 TABLET BY MOUTH EVERY DAY 05/14/22   Billie Ruddy, MD  Multiple Vitamin (MULTIVITAMINS PO) Take by mouth.    [provider]    Physical Exam: Vitals:   08/27/22 1800 08/27/22 1830 08/27/22 1926 08/27/22 2102  BP: (!) 144/87 (!) 146/62 126/62 (!) 144/74  Pulse: (!) 103 95 93 97  Resp: '18 18 16 18  '$ Temp:   99 F (37.2 C) 98.6 F (37 C)  TempSrc:   Oral Oral  SpO2: 98% 99% 99% 99%  Weight:      Height:        Physical Exam Constitutional:      General: She is not in acute distress.    Appearance: Normal appearance.  HENT:     Head: Normocephalic and atraumatic.     Mouth/Throat:     Mouth: Mucous membranes are moist.     Pharynx: Oropharynx is clear.  Eyes:     Extraocular Movements: Extraocular movements intact.     Pupils: Pupils are equal, round, and reactive to light.  Cardiovascular:     Rate and Rhythm: Normal rate and regular rhythm.  Pulses: Normal pulses.     Heart sounds: Normal heart sounds.  Pulmonary:     Effort: Pulmonary effort is normal. No respiratory distress.     Breath sounds: Normal breath sounds.  Abdominal:     General: Bowel sounds are normal. There is no distension.     Palpations: Abdomen is soft.     Tenderness: There is no abdominal tenderness.  Musculoskeletal:        General: No swelling or deformity.     Comments: Bilateral lower extremities neurovascularly intact.  Skin:    General: Skin is warm and dry.  Neurological:     General: No focal deficit present.     Mental Status: Mental status is at baseline.     Labs on Admission: I have personally reviewed  following labs and imaging studies  CBC: Recent Labs  Lab 08/27/22 1612  WBC 14.3*  NEUTROABS 11.9*  HGB 13.6  HCT 38.2  MCV 89.7  PLT 387    Basic Metabolic Panel: Recent Labs  Lab 08/27/22 1612  NA 125*  K 3.5  CL 88*  CO2 25  GLUCOSE 121*  BUN 12  CREATININE 0.86  CALCIUM 9.5    GFR: Estimated Creatinine Clearance: 64.9 mL/min (by C-G formula based on SCr of 0.86 mg/dL).  Liver Function Tests: No results for input(s): "AST", "ALT", "ALKPHOS", "BILITOT", "PROT", "ALBUMIN" in the last 168 hours.  Urine analysis: No results found for: "COLORURINE", "APPEARANCEUR", "LABSPEC", "PHURINE", "GLUCOSEU", "HGBUR", "BILIRUBINUR", "KETONESUR", "PROTEINUR", "UROBILINOGEN", "NITRITE", "LEUKOCYTESUR"  Radiological Exams on Admission: CT Head Wo Contrast  Result Date: 08/27/2022 CLINICAL DATA:  Head trauma, minor (Age >= 65y). Fall when playing tennis EXAM: CT HEAD WITHOUT CONTRAST TECHNIQUE: Contiguous axial images were obtained from the base of the skull through the vertex without intravenous contrast. RADIATION DOSE REDUCTION: This exam was performed according to the departmental dose-optimization program which includes automated exposure control, adjustment of the mA and/or kV according to patient size and/or use of iterative reconstruction technique. COMPARISON:  None Available. FINDINGS: Brain: No acute intracranial abnormality. Specifically, no hemorrhage, hydrocephalus, mass lesion, acute infarction, or significant intracranial injury. Vascular: No hyperdense vessel or unexpected calcification. Skull: No acute calvarial abnormality. Sinuses/Orbits: No acute findings Other: None IMPRESSION: Normal study. Electronically Signed   By: Rolm Baptise M.D.   On: 08/27/2022 18:01   DG Knee Complete 4 Views Right  Result Date: 08/27/2022 CLINICAL DATA:  Fall EXAM: RIGHT KNEE - COMPLETE 4 VIEW COMPARISON:  None Available. FINDINGS: No evidence of fracture, dislocation, or joint effusion.  No evidence of arthropathy or other focal bone abnormality. Soft tissues are unremarkable. IMPRESSION: No acute osseous abnormality. Electronically Signed   By: Yetta Glassman M.D.   On: 08/27/2022 16:13   DG Hand Complete Right  Result Date: 08/27/2022 CLINICAL DATA:  Right hand pain after fall. EXAM: RIGHT HAND - COMPLETE 3+ VIEW COMPARISON:  None Available. FINDINGS: Minimally displaced fracture is seen involving the first distal phalanx. No other fracture or dislocation is noted. Joint spaces are unremarkable. IMPRESSION: Minimally displaced first distal phalangeal fracture. Electronically Signed   By: Marijo Conception M.D.   On: 08/27/2022 15:12   DG Hip Unilat  With Pelvis 2-3 Views Right  Result Date: 08/27/2022 CLINICAL DATA:  Right hip pain after fall. EXAM: DG HIP (WITH OR WITHOUT PELVIS) 2-3V RIGHT COMPARISON:  December 22, 2020. FINDINGS: Mildly displaced fracture is seen involving the proximal right femoral neck. Status post left hip arthroplasty. IMPRESSION: Mildly displaced proximal  right femoral neck fracture. Electronically Signed   By: Marijo Conception M.D.   On: 08/27/2022 15:11    EKG: Not performed in the emergency department  Assessment/Plan Principal Problem:   Closed displaced fracture of right femoral neck (HCC) Active Problems:   Low back pain   Spinal stenosis, lumbar region, with neurogenic claudication   Essential hypertension   Osteoporosis   Fall Osteoporosis Right minimally displaced femoral neck fracture > Patient fell while moving to return a serve while playing tennis.  Had hip pain after falling and found to have fracture on imaging in ED. > Did hit her head but CT head without acute abnormality. > Mild leukocytosis noted at 14.3 likely reactive. > Does have history of prior surgery on left hip and osteoporosis. > Orthopedics consulted and will see patient in the morning with plan for surgery tomorrow. - Appreciate orthopedics recommendations - N.p.o.  at midnight - Continue with as needed pain control - PRN Zofran - Consult anesthesia for nerve block when able - Continue supportive care - Nonweightbearing  Hyponatremia > Patient noted to have sodium of 125 in ED.  Recent baseline values are closer to 130. > Patient noted to be on hydrochlorothiazide which may be the culprit. > Received a liter of fluids in ED.  Goal sodium of 133 or less tomorrow morning. - Trend renal function and electrolytes overnight - Hold off on further fluids given initial administration in the ED - Hold home hydrochlorothiazide - Serum osmolality, urine osmolality, urine sodium  Hypertension - Continue home lisinopril - Holding home hydrochlorothiazide as above  DVT prophylaxis: SCDs Code Status:   Full Family Communication:  Updated at bedside  Disposition Plan:   Patient is from:  Home  Anticipated DC to:  Home  Anticipated DC date:  2 to 3 days  Anticipated DC barriers: None  Consults called:  Orthopedics, consulted in the ED Admission status:  Inpatient, MedSurg  Severity of Illness: The appropriate patient status for this patient is INPATIENT. Inpatient status is judged to be reasonable and necessary in order to provide the required intensity of service to ensure the patient's safety. The patient's presenting symptoms, physical exam findings, and initial radiographic and laboratory data in the context of their chronic comorbidities is felt to place them at high risk for further clinical deterioration. Furthermore, it is not anticipated that the patient will be medically stable for discharge from the hospital within 2 midnights of admission.   * I certify that at the point of admission it is my clinical judgment that the patient will require inpatient hospital care spanning beyond 2 midnights from the point of admission due to high intensity of service, high risk for further deterioration and high frequency of surveillance required.Marcelyn Bruins MD [facility]  How to contact the [facility] Attending or Consulting provider [place] or covering provider during after hours [place], for this patient?   Check the care team in [facility] and look for a) attending/consulting [facility] provider listed and b) the [facility] team listed Log into [email or web address] and use [place]'s universal password to access. If you do not have the password, please contact the hospital operator. Locate the [facility] provider you are looking for under [facility] and page to a number that you can be directly reached. If you still have difficulty reaching the provider, please page the [facility] (Director on Call) for the Hospitalists listed on amion for assistance.  08/27/2022, 9:28 PM

## 2022-08-27 NOTE — ED Notes (Addendum)
Report given to [place] @ [facility]

## 2022-08-27 NOTE — ED Provider Notes (Signed)
[facility] EMERGENCY DEPT Provider Note   CSN: [id number] Arrival date & time: 08/27/22  1347     History  Chief Complaint  Patient presents with   Sherry Harrison is a 66 y.o. female.  Patient presents to the emergency department via EMS complaining of right-sided hip and hand pain secondary to a fall.  Patient was playing tennis this afternoon when she went for a ball and stumbled and fell.  She immediately had pain in the right hip region.  She did hit the back of her head upon falling but denies losing consciousness and denies blood thinner usage.  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 Authorizing Provider  Calcium Carb-Cholecalciferol (CALCIUM 600-D PO) Take 600 mg by mouth daily.    [provider]  lisinopril-hydrochlorothiazide (ZESTORETIC) 10-12.5 MG tablet TAKE 1 TABLET BY MOUTH EVERY DAY 05/14/22   Billie Ruddy, MD  Multiple Vitamin (MULTIVITAMINS PO) Take by mouth.    [provider]      Allergies    Patient has no known allergies.    Review of Systems   Review of Systems  Musculoskeletal:  Positive for arthralgias.  Neurological:  Positive for headaches. Negative for syncope.    Physical Exam Updated Vital Signs BP 122/64   Pulse 82   Temp (!) 97.5 F (36.4 C)   Resp 16   Ht '5\' 8"'$  (1.727 m)   Wt 70.9 kg   SpO2 100%   BMI 23.77 kg/m  Physical Exam Vitals and nursing note reviewed.  Constitutional:      General: She is not in acute distress.    Appearance: She is well-developed.  HENT:     Head: Normocephalic and atraumatic.  Eyes:     Conjunctiva/sclera: Conjunctivae normal.     Pupils: Pupils are equal, round, and reactive to light.  Cardiovascular:     Rate and  Rhythm: Normal rate and regular rhythm.     Heart sounds: No murmur heard. Pulmonary:     Effort: Pulmonary effort is normal. No respiratory distress.     Breath sounds: Normal breath sounds.  Abdominal:     Palpations: Abdomen is soft.     Tenderness: There is no abdominal tenderness.  Musculoskeletal:        General: Tenderness present. No swelling.     Cervical back: Neck supple.     Comments: Minimal shortening and external rotation of RLE, strong pedal pulse. Patient able to fire quads. No deformity noted to right thumb. Brisk cap refill.   Skin:    General: Skin is warm and dry.     Capillary Refill: Capillary refill takes less than 2 seconds.  Neurological:     Mental Status: She is alert.  Psychiatric:        Mood and Affect: Mood normal.     ED Results / Procedures / Treatments   Labs (all labs ordered are listed, but only abnormal results are displayed) Labs Reviewed  BASIC METABOLIC PANEL - Abnormal; Notable for the following components:      Result Value   Sodium 125 (*)    Chloride 88 (*)    Glucose, Bld 121 (*)    All other components within normal limits  CBC WITH DIFFERENTIAL/PLATELET -  Abnormal; Notable for the following components:   WBC 14.3 (*)    Neutro Abs 11.9 (*)    All other components within normal limits    EKG None  Radiology CT Head Wo Contrast  Result Date: 08/27/2022 CLINICAL DATA:  Head trauma, minor (Age >= 65y). Fall when playing tennis EXAM: CT HEAD WITHOUT CONTRAST TECHNIQUE: Contiguous axial images were obtained from the base of the skull through the vertex without intravenous contrast. RADIATION DOSE REDUCTION: This exam was performed according to the departmental dose-optimization program which includes automated exposure control, adjustment of the mA and/or kV according to patient size and/or use of iterative reconstruction technique. COMPARISON:  None Available. FINDINGS: Brain: No acute intracranial abnormality. Specifically, no  hemorrhage, hydrocephalus, mass lesion, acute infarction, or significant intracranial injury. Vascular: No hyperdense vessel or unexpected calcification. Skull: No acute calvarial abnormality. Sinuses/Orbits: No acute findings Other: None IMPRESSION: Normal study. Electronically Signed   By: Rolm Baptise M.D.   On: 08/27/2022 18:01   DG Knee Complete 4 Views Right  Result Date: 08/27/2022 CLINICAL DATA:  Fall EXAM: RIGHT KNEE - COMPLETE 4 VIEW COMPARISON:  None Available. FINDINGS: No evidence of fracture, dislocation, or joint effusion. No evidence of arthropathy or other focal bone abnormality. Soft tissues are unremarkable. IMPRESSION: No acute osseous abnormality. Electronically Signed   By: Yetta Glassman M.D.   On: 08/27/2022 16:13   DG Hand Complete Right  Result Date: 08/27/2022 CLINICAL DATA:  Right hand pain after fall. EXAM: RIGHT HAND - COMPLETE 3+ VIEW COMPARISON:  None Available. FINDINGS: Minimally displaced fracture is seen involving the first distal phalanx. No other fracture or dislocation is noted. Joint spaces are unremarkable. IMPRESSION: Minimally displaced first distal phalangeal fracture. Electronically Signed   By: Marijo Conception M.D.   On: 08/27/2022 15:12   DG Hip Unilat  With Pelvis 2-3 Views Right  Result Date: 08/27/2022 CLINICAL DATA:  Right hip pain after fall. EXAM: DG HIP (WITH OR WITHOUT PELVIS) 2-3V RIGHT COMPARISON:  December 22, 2020. FINDINGS: Mildly displaced fracture is seen involving the proximal right femoral neck. Status post left hip arthroplasty. IMPRESSION: Mildly displaced proximal right femoral neck fracture. Electronically Signed   By: Marijo Conception M.D.   On: 08/27/2022 15:11    Procedures .Ortho Injury Treatment  Date/Time: 08/27/2022 6:27 PM  Performed by: [name], PA-C Authorized by: [name], PA-C   Consent:    Consent obtained:  Verbal   Consent given by:  Patient   Risks discussed:  Restricted joint movement,  stiffness, vascular damage and nerve damage   Alternatives discussed:  No treatmentInjury location: finger Location details: right thumb Injury type: fracture Fracture type: distal phalanx Pre-procedure neurovascular assessment: neurovascularly intact Immobilization: splint Splint Applied by: ED Tech Post-procedure neurovascular assessment: post-procedure neurovascularly intact       Medications Ordered in ED Medications  fentaNYL (SUBLIMAZE) injection 50 mcg (50 mcg Intravenous Given 08/27/22 1717)  ondansetron (ZOFRAN) injection 4 mg (4 mg Intravenous Given 08/27/22 1714)  sodium chloride 0.9 % bolus 1,000 mL (1,000 mLs Intravenous New Bag/Given 08/27/22 1717)    ED Course/ Medical Decision Making/ A&P                           Medical Decision Making Amount and/or Complexity of Data Reviewed Labs: ordered. Radiology: ordered.  Risk Prescription drug management. Decision regarding hospitalization.   This patient presents to the ED for concern of  right-sided hip pain, this involves an extensive number of treatment options, and is a complaint that carries with it a high risk of complications and morbidity.  The differential diagnosis includes but is not limited to fracture, dislocation, soft tissue injury, and others   Co morbidities that complicate the patient evaluation  History of arthritis   Additional history obtained:  Additional history obtained from husband and EMS    Lab Tests:  I Ordered, and personally interpreted labs.  The pertinent results include: WBC 14.3, sodium 125   Imaging Studies ordered:  I ordered imaging studies including plain films of the right hip, right knee, right hand, and CT head without contrast I independently visualized and interpreted imaging which showed mildly displaced right proximal femoral neck fracture, negative knee x-ray, minimally displaced first distal phalangeal fracture right hand, negative head CT I agree with the  radiologist interpretation  Consultations Obtained:  I requested consultation with the orthopedist on-call for [facility],   and discussed lab and imaging findings as well as pertinent plan - they recommend: Admission to [facility] with plans for surgery on Tuesday  I requested consultation with the hospitalist, Dr. Eliseo Squires, and discussed relevant findings.  They agreed to admit the patient hospital.   Problem List / ED Course / Critical interventions / Medication management   I ordered medication including fentanyl for pain, Zofran for nausea Reevaluation of the patient after these medicines showed that the patient improved I have reviewed the patients home medicines and have made adjustments as needed   Test / Admission - Considered:  The patient has a hip fracture on the right side and will need surgical intervention.  Patient to be admitted to [facility]        Final Clinical Impression(s) / ED Diagnoses Final diagnoses:  Closed fracture of neck of right femur, initial encounter ([place])  Nondisplaced fracture of distal phalanx of right thumb, initial encounter for closed fracture    Rx / DC Orders ED Discharge Orders     None         Ronny Bacon 08/27/22 [place], Dan, DO 08/27/22 2010

## 2022-08-28 ENCOUNTER — Inpatient Hospital Stay (HOSPITAL_COMMUNITY): Payer: Medicare Other

## 2022-08-28 ENCOUNTER — Inpatient Hospital Stay (HOSPITAL_COMMUNITY): Payer: Medicare Other | Admitting: Anesthesiology

## 2022-08-28 ENCOUNTER — Other Ambulatory Visit: Payer: Self-pay

## 2022-08-28 ENCOUNTER — Encounter (HOSPITAL_COMMUNITY): Payer: Self-pay | Admitting: Internal Medicine

## 2022-08-28 ENCOUNTER — Encounter (HOSPITAL_COMMUNITY)
Admission: EM | Disposition: A | Discharge status: Home / Self Care | Payer: Self-pay | Source: Home / Self Care | Attending: Internal Medicine

## 2022-08-28 DIAGNOSIS — E871 Hypo-osmolality and hyponatremia: Secondary | ICD-10-CM | POA: Diagnosis not present

## 2022-08-28 DIAGNOSIS — S72001A Fracture of unspecified part of neck of right femur, initial encounter for closed fracture: Secondary | ICD-10-CM | POA: Diagnosis not present

## 2022-08-28 DIAGNOSIS — I10 Essential (primary) hypertension: Secondary | ICD-10-CM | POA: Diagnosis not present

## 2022-08-28 HISTORY — PX: TOTAL HIP ARTHROPLASTY: SHX124

## 2022-08-28 LAB — CBC
HCT: 37.3 % (ref 36.0–46.0)
Hemoglobin: 12.8 g/dL (ref 12.0–15.0)
MCH: 32.2 pg (ref 26.0–34.0)
MCHC: 34.3 g/dL (ref 30.0–36.0)
MCV: 94 fL (ref 80.0–100.0)
Platelets: 263 10*3/uL (ref 150–400)
RBC: 3.97 MIL/uL (ref 3.87–5.11)
RDW: 11.8 % (ref 11.5–15.5)
WBC: 10.4 10*3/uL (ref 4.0–10.5)
nRBC: 0 % (ref 0.0–0.2)

## 2022-08-28 LAB — BASIC METABOLIC PANEL
Anion gap: 7 (ref 5–15)
BUN: 12 mg/dL (ref 8–23)
CO2: 29 mmol/L (ref 22–32)
Calcium: 9 mg/dL (ref 8.9–10.3)
Chloride: 97 mmol/L — ABNORMAL LOW (ref 98–111)
Creatinine, Ser: 0.8 mg/dL (ref 0.44–1.00)
GFR, Estimated: >60 mL/min (ref >60)
Glucose, Bld: 143 mg/dL — ABNORMAL HIGH (ref 70–99)
Potassium: 4.5 mmol/L (ref 3.5–5.1)
Sodium: 133 mmol/L — ABNORMAL LOW (ref 135–145)

## 2022-08-28 LAB — TYPE AND SCREEN
ABO/RH(D): A POS
Antibody Screen: NEGATIVE

## 2022-08-28 LAB — SURGICAL PCR SCREEN
MRSA, PCR: NEGATIVE
Staphylococcus aureus: NEGATIVE

## 2022-08-28 LAB — OSMOLALITY, URINE: Osmolality, Ur: 410 mOsm/kg (ref 300–900)

## 2022-08-28 LAB — OSMOLALITY: Osmolality: 279 mOsm/kg (ref 275–295)

## 2022-08-28 LAB — ABO/RH: ABO/RH(D): A POS

## 2022-08-28 LAB — HIV ANTIBODY (ROUTINE TESTING W REFLEX): HIV Screen 4th Generation wRfx: NONREACTIVE

## 2022-08-28 LAB — SODIUM, URINE, RANDOM: Sodium, Ur: 86 mmol/L

## 2022-08-28 SURGERY — ARTHROPLASTY, HIP, TOTAL, ANTERIOR APPROACH
Anesthesia: Spinal | Site: Hip | Laterality: Right

## 2022-08-28 MED ORDER — 0.9 % SODIUM CHLORIDE (POUR BTL) OPTIME
TOPICAL | Status: DC | PRN
  Administered 2022-08-28: 1000 mL

## 2022-08-28 MED ORDER — DOCUSATE SODIUM 100 MG PO CAPS
100.0000 mg | ORAL_CAPSULE | Freq: Two times a day (BID) | ORAL | Status: DC
  Administered 2022-08-28 – 2022-08-29 (×2): 100 mg via ORAL
  Filled 2022-08-28 (×2): qty 1

## 2022-08-28 MED ORDER — DIPHENHYDRAMINE HCL 12.5 MG/5ML PO ELIX: 12.5000 mg | ORAL_SOLUTION | ORAL | Status: DC | PRN

## 2022-08-28 MED ORDER — MENTHOL 3 MG MT LOZG: 1.0000 | LOZENGE | OROMUCOSAL | Status: DC | PRN

## 2022-08-28 MED ORDER — KETOROLAC TROMETHAMINE 15 MG/ML IJ SOLN
7.5000 mg | Freq: Four times a day (QID) | INTRAMUSCULAR | Status: DC
  Administered 2022-08-28 – 2022-08-29 (×2): 7.5 mg via INTRAVENOUS
  Filled 2022-08-28 (×2): qty 1

## 2022-08-28 MED ORDER — BUPIVACAINE-EPINEPHRINE (PF) 0.25% -1:200000 IJ SOLN
INTRAMUSCULAR | Status: AC
  Filled 2022-08-28: qty 30

## 2022-08-28 MED ORDER — IBUPROFEN 400 MG PO TABS: 400.0000 mg | ORAL_TABLET | Freq: Four times a day (QID) | ORAL | Status: DC | PRN

## 2022-08-28 MED ORDER — ALUM & MAG HYDROXIDE-SIMETH 200-200-20 MG/5ML PO SUSP: 30.0000 mL | ORAL | Status: DC | PRN

## 2022-08-28 MED ORDER — DEXAMETHASONE SODIUM PHOSPHATE 10 MG/ML IJ SOLN
INTRAMUSCULAR | Status: DC | PRN
  Administered 2022-08-28: 5 mg via INTRAVENOUS

## 2022-08-28 MED ORDER — PHENYLEPHRINE 80 MCG/ML (10ML) SYRINGE FOR IV PUSH (FOR BLOOD PRESSURE SUPPORT)
PREFILLED_SYRINGE | INTRAVENOUS | Status: AC
  Filled 2022-08-28: qty 10

## 2022-08-28 MED ORDER — PROPOFOL 500 MG/50ML IV EMUL
INTRAVENOUS | Status: DC | PRN
  Administered 2022-08-28: 75 ug/kg/min via INTRAVENOUS

## 2022-08-28 MED ORDER — TRANEXAMIC ACID 1000 MG/10ML IV SOLN
2000.0000 mg | INTRAVENOUS | Status: DC
  Filled 2022-08-28: qty 20

## 2022-08-28 MED ORDER — BUPIVACAINE IN DEXTROSE 0.75-8.25 % IT SOLN
INTRATHECAL | Status: DC | PRN
  Administered 2022-08-28: 1.8 mL via INTRATHECAL

## 2022-08-28 MED ORDER — CEFAZOLIN SODIUM-DEXTROSE 2-4 GM/100ML-% IV SOLN
2.0000 g | Freq: Four times a day (QID) | INTRAVENOUS | Status: AC
  Administered 2022-08-28 – 2022-08-29 (×2): 2 g via INTRAVENOUS
  Filled 2022-08-28 (×2): qty 100

## 2022-08-28 MED ORDER — METOCLOPRAMIDE HCL 5 MG PO TABS: 5.0000 mg | ORAL_TABLET | Freq: Three times a day (TID) | ORAL | Status: DC | PRN

## 2022-08-28 MED ORDER — CEFAZOLIN SODIUM-DEXTROSE 2-4 GM/100ML-% IV SOLN
2.0000 g | INTRAVENOUS | Status: AC
  Administered 2022-08-28: 2 g via INTRAVENOUS
  Filled 2022-08-28: qty 100

## 2022-08-28 MED ORDER — PHENYLEPHRINE HCL (PRESSORS) 10 MG/ML IV SOLN
INTRAVENOUS | Status: AC
  Filled 2022-08-28: qty 1

## 2022-08-28 MED ORDER — DEXAMETHASONE SODIUM PHOSPHATE 10 MG/ML IJ SOLN
INTRAMUSCULAR | Status: AC
  Filled 2022-08-28: qty 1

## 2022-08-28 MED ORDER — FENTANYL CITRATE (PF) 100 MCG/2ML IJ SOLN
INTRAMUSCULAR | Status: AC
  Filled 2022-08-28: qty 2

## 2022-08-28 MED ORDER — ONDANSETRON HCL 4 MG PO TABS: 4.0000 mg | ORAL_TABLET | Freq: Four times a day (QID) | ORAL | Status: DC | PRN

## 2022-08-28 MED ORDER — MIDAZOLAM HCL 2 MG/2ML IJ SOLN
INTRAMUSCULAR | Status: DC | PRN
  Administered 2022-08-28: 2 mg via INTRAVENOUS

## 2022-08-28 MED ORDER — TRANEXAMIC ACID-NACL 1000-0.7 MG/100ML-% IV SOLN
1000.0000 mg | Freq: Once | INTRAVENOUS | Status: AC
  Administered 2022-08-28: 1000 mg via INTRAVENOUS
  Filled 2022-08-28: qty 100

## 2022-08-28 MED ORDER — MORPHINE SULFATE (PF) 2 MG/ML IV SOLN: 0.5000 mg | INTRAVENOUS | Status: DC | PRN

## 2022-08-28 MED ORDER — LACTATED RINGERS IV SOLN: INTRAVENOUS | Status: DC

## 2022-08-28 MED ORDER — ACETAMINOPHEN 500 MG PO TABS
500.0000 mg | ORAL_TABLET | Freq: Four times a day (QID) | ORAL | Status: DC
  Administered 2022-08-28 – 2022-08-29 (×2): 500 mg via ORAL
  Filled 2022-08-28 (×3): qty 1

## 2022-08-28 MED ORDER — TRANEXAMIC ACID-NACL 1000-0.7 MG/100ML-% IV SOLN
1000.0000 mg | INTRAVENOUS | Status: AC
  Administered 2022-08-28: 1000 mg via INTRAVENOUS
  Filled 2022-08-28: qty 100

## 2022-08-28 MED ORDER — ONDANSETRON HCL 4 MG/2ML IJ SOLN: 4.0000 mg | Freq: Four times a day (QID) | INTRAMUSCULAR | Status: DC | PRN

## 2022-08-28 MED ORDER — ACETAMINOPHEN 325 MG PO TABS: 325.0000 mg | ORAL_TABLET | Freq: Four times a day (QID) | ORAL | Status: DC | PRN

## 2022-08-28 MED ORDER — BUPIVACAINE LIPOSOME 1.3 % IJ SUSP: 10.0000 mL | Freq: Once | INTRAMUSCULAR | Status: DC

## 2022-08-28 MED ORDER — BUPIVACAINE LIPOSOME 1.3 % IJ SUSP
INTRAMUSCULAR | Status: DC | PRN
  Administered 2022-08-28: 10 mL

## 2022-08-28 MED ORDER — TRAMADOL HCL 50 MG PO TABS
50.0000 mg | ORAL_TABLET | Freq: Four times a day (QID) | ORAL | Status: DC | PRN
  Administered 2022-08-28: 50 mg via ORAL
  Filled 2022-08-28: qty 1

## 2022-08-28 MED ORDER — METHOCARBAMOL 1000 MG/10ML IJ SOLN: 500.0000 mg | Freq: Four times a day (QID) | INTRAVENOUS | Status: DC | PRN

## 2022-08-28 MED ORDER — PROPOFOL 500 MG/50ML IV EMUL
INTRAVENOUS | Status: AC
  Filled 2022-08-28: qty 50

## 2022-08-28 MED ORDER — BISACODYL 5 MG PO TBEC: 5.0000 mg | DELAYED_RELEASE_TABLET | Freq: Every day | ORAL | Status: DC | PRN

## 2022-08-28 MED ORDER — ONDANSETRON HCL 4 MG/2ML IJ SOLN
4.0000 mg | Freq: Once | INTRAMUSCULAR | Status: AC
  Administered 2022-08-28: 4 mg via INTRAVENOUS
  Filled 2022-08-28: qty 2

## 2022-08-28 MED ORDER — POVIDONE-IODINE 10 % EX SWAB: 2.0000 | Freq: Once | CUTANEOUS | Status: DC

## 2022-08-28 MED ORDER — PHENYLEPHRINE HCL-NACL 20-0.9 MG/250ML-% IV SOLN
INTRAVENOUS | Status: DC | PRN
  Administered 2022-08-28: 50 ug/min via INTRAVENOUS

## 2022-08-28 MED ORDER — PHENOL 1.4 % MT LIQD: 1.0000 | OROMUCOSAL | Status: DC | PRN

## 2022-08-28 MED ORDER — HYDROCODONE-ACETAMINOPHEN 5-325 MG PO TABS: 1.0000 | ORAL_TABLET | ORAL | Status: DC | PRN

## 2022-08-28 MED ORDER — PROPOFOL 10 MG/ML IV BOLUS
INTRAVENOUS | Status: DC | PRN
  Administered 2022-08-28 (×2): 20 mg via INTRAVENOUS
  Administered 2022-08-28 (×2): 30 mg via INTRAVENOUS

## 2022-08-28 MED ORDER — ONDANSETRON HCL 4 MG/2ML IJ SOLN
INTRAMUSCULAR | Status: AC
  Filled 2022-08-28: qty 2

## 2022-08-28 MED ORDER — METHOCARBAMOL 500 MG PO TABS: 500.0000 mg | ORAL_TABLET | Freq: Four times a day (QID) | ORAL | Status: DC | PRN

## 2022-08-28 MED ORDER — MIDAZOLAM HCL 2 MG/2ML IJ SOLN
INTRAMUSCULAR | Status: AC
  Filled 2022-08-28: qty 2

## 2022-08-28 MED ORDER — METOCLOPRAMIDE HCL 5 MG/ML IJ SOLN: 5.0000 mg | Freq: Three times a day (TID) | INTRAMUSCULAR | Status: DC | PRN

## 2022-08-28 MED ORDER — BUPIVACAINE-EPINEPHRINE (PF) 0.25% -1:200000 IJ SOLN
INTRAMUSCULAR | Status: DC | PRN
  Administered 2022-08-28: 30 mL

## 2022-08-28 MED ORDER — FENTANYL CITRATE (PF) 100 MCG/2ML IJ SOLN
INTRAMUSCULAR | Status: DC | PRN
  Administered 2022-08-28: 100 ug via INTRAVENOUS

## 2022-08-28 MED ORDER — FENTANYL CITRATE PF 50 MCG/ML IJ SOSY: 25.0000 ug | PREFILLED_SYRINGE | INTRAMUSCULAR | Status: DC | PRN

## 2022-08-28 MED ORDER — BUPIVACAINE LIPOSOME 1.3 % IJ SUSP
INTRAMUSCULAR | Status: AC
  Filled 2022-08-28: qty 20

## 2022-08-28 MED ORDER — AMISULPRIDE (ANTIEMETIC) 5 MG/2ML IV SOLN: 10.0000 mg | Freq: Once | INTRAVENOUS | Status: DC | PRN

## 2022-08-28 MED ORDER — ASPIRIN 81 MG PO CHEW
81.0000 mg | CHEWABLE_TABLET | Freq: Two times a day (BID) | ORAL | Status: DC
  Administered 2022-08-29: 81 mg via ORAL
  Filled 2022-08-28: qty 1

## 2022-08-28 MED ORDER — SODIUM CHLORIDE (PF) 0.9 % IJ SOLN
INTRAMUSCULAR | Status: AC
  Filled 2022-08-28: qty 30

## 2022-08-28 MED ORDER — TRANEXAMIC ACID 1000 MG/10ML IV SOLN
INTRAVENOUS | Status: DC | PRN
  Administered 2022-08-28: 2000 mg via TOPICAL

## 2022-08-28 MED ORDER — ONDANSETRON HCL 4 MG/2ML IJ SOLN
INTRAMUSCULAR | Status: DC | PRN
  Administered 2022-08-28: 4 mg via INTRAVENOUS

## 2022-08-28 MED ORDER — ENOXAPARIN SODIUM 40 MG/0.4ML IJ SOSY: 40.0000 mg | PREFILLED_SYRINGE | INTRAMUSCULAR | Status: DC

## 2022-08-28 MED ORDER — ONDANSETRON HCL 4 MG/2ML IJ SOLN: 4.0000 mg | Freq: Once | INTRAMUSCULAR | Status: DC | PRN

## 2022-08-28 MED ORDER — PHENYLEPHRINE 80 MCG/ML (10ML) SYRINGE FOR IV PUSH (FOR BLOOD PRESSURE SUPPORT)
PREFILLED_SYRINGE | INTRAVENOUS | Status: DC | PRN
  Administered 2022-08-28: 160 ug via INTRAVENOUS
  Administered 2022-08-28: 80 ug via INTRAVENOUS
  Administered 2022-08-28: 160 ug via INTRAVENOUS
  Administered 2022-08-28: 80 ug via INTRAVENOUS

## 2022-08-28 MED ORDER — KETOROLAC TROMETHAMINE 15 MG/ML IJ SOLN: 15.0000 mg | Freq: Four times a day (QID) | INTRAMUSCULAR | Status: DC | PRN

## 2022-08-28 SURGICAL SUPPLY — 44 items
BAG COUNTER SPONGE SURGICOUNT (BAG) IMPLANT
BAG DECANTER FOR FLEXI CONT (MISCELLANEOUS) ×1 IMPLANT
BLADE SAW SGTL 18X1.27X75 (BLADE) ×1 IMPLANT
BOOTIES KNEE HIGH SLOAN (MISCELLANEOUS) ×1 IMPLANT
CELLS DAT CNTRL 66122 CELL SVR (MISCELLANEOUS) ×1 IMPLANT
COVER PERINEAL POST (MISCELLANEOUS) ×1 IMPLANT
COVER SURGICAL LIGHT HANDLE (MISCELLANEOUS) ×1 IMPLANT
CUP ACETABULAR GRIPTON 100 52 (Orthopedic Implant) IMPLANT
DRAPE FOOT SWITCH (DRAPES) ×1 IMPLANT
DRAPE IMP U-DRAPE 54X76 (DRAPES) ×1 IMPLANT
DRAPE STERI IOBAN 125X83 (DRAPES) ×1 IMPLANT
DRAPE U-SHAPE 47X51 STRL (DRAPES) ×2 IMPLANT
DRSG AQUACEL AG ADV 3.5X 6 (GAUZE/BANDAGES/DRESSINGS) ×1 IMPLANT
DURAPREP 26ML APPLICATOR (WOUND CARE) ×1 IMPLANT
ELECT BLADE TIP CTD 4 INCH (ELECTRODE) ×1 IMPLANT
ELECT REM PT RETURN 15FT ADLT (MISCELLANEOUS) ×1 IMPLANT
ELIMINATOR HOLE APEX DEPUY (Hips) IMPLANT
GLOVE BIO SURGEON STRL SZ8 (GLOVE) ×2 IMPLANT
GLOVE BIOGEL PI IND STRL 8 (GLOVE) ×2 IMPLANT
GOWN STRL REUS W/ TWL XL LVL3 (GOWN DISPOSABLE) ×2 IMPLANT
GOWN STRL REUS W/TWL XL LVL3 (GOWN DISPOSABLE) ×2
GRIPTON 100 52 (Orthopedic Implant) ×1 IMPLANT
HEAD CERAMIC DELTA 36 PLUS 1.5 (Hips) IMPLANT
HOLDER FOLEY CATH W/STRAP (MISCELLANEOUS) ×1 IMPLANT
KIT TURNOVER KIT A (KITS) IMPLANT
LINER NEUTRAL 52X36MM PLUS 4 (Liner) IMPLANT
MANIFOLD NEPTUNE II (INSTRUMENTS) ×1 IMPLANT
NEEDLE HYPO 22GX1.5 SAFETY (NEEDLE) ×1 IMPLANT
NS IRRIG 1000ML POUR BTL (IV SOLUTION) ×1 IMPLANT
PACK ANTERIOR HIP CUSTOM (KITS) ×1 IMPLANT
PENCIL SMOKE EVACUATOR (MISCELLANEOUS) IMPLANT
PROTECTOR NERVE ULNAR (MISCELLANEOUS) ×1 IMPLANT
RETRACTOR WND ALEXIS 18 MED (MISCELLANEOUS) ×1 IMPLANT
RTRCTR WOUND ALEXIS 18CM MED (MISCELLANEOUS) ×1
SPIKE FLUID TRANSFER (MISCELLANEOUS) ×1 IMPLANT
STEM FEMORAL SZ9 STD ACTIS (Stem) IMPLANT
SUT ETHIBOND NAB CT1 #1 30IN (SUTURE) ×2 IMPLANT
SUT VIC AB 1 CT1 36 (SUTURE) ×1 IMPLANT
SUT VIC AB 2-0 CT1 27 (SUTURE) ×1
SUT VIC AB 2-0 CT1 TAPERPNT 27 (SUTURE) ×1 IMPLANT
SUT VICRYL AB 3-0 FS1 BRD 27IN (SUTURE) ×1 IMPLANT
SUT VLOC 180 0 24IN GS25 (SUTURE) ×1 IMPLANT
SYR 50ML LL SCALE MARK (SYRINGE) ×1 IMPLANT
TRAY FOLEY MTR SLVR 16FR STAT (SET/KITS/TRAYS/PACK) ×1 IMPLANT

## 2022-08-28 NOTE — Anesthesia Procedure Notes (Signed)
Spinal  Patient location during procedure: OR Start time: 08/28/2022 2:42 PM End time: 08/28/2022 2:52 PM Reason for block: surgical anesthesia Staffing Performed: anesthesiologist  Anesthesiologist: Roderic Palau, MD Performed by: Roderic Palau, MD Authorized by: Roderic Palau, MD   Preanesthetic Checklist Completed: patient identified, IV checked, risks and benefits discussed, surgical consent, monitors and equipment checked, pre-op evaluation and timeout performed Spinal Block Patient position: right lateral decubitus Prep: DuraPrep Patient monitoring: cardiac monitor, continuous pulse ox and blood pressure Approach: midline Location: L3-4 Injection technique: single-shot Needle Needle type: Quincke  Needle gauge: 22 G Needle length: 9 cm Assessment Sensory level: T8 Events: CSF return Additional Notes Functioning IV was confirmed and monitors were applied. Sterile prep and drape, including hand hygiene and sterile gloves were used. The patient was positioned and the spine was prepped. The skin was anesthetized with lidocaine.  Free flow of clear CSF was obtained prior to injecting local anesthetic into the CSF.  The spinal needle aspirated freely following injection.  The needle was carefully withdrawn.  The patient tolerated the procedure well.

## 2022-08-28 NOTE — Progress Notes (Addendum)
PROGRESS NOTE    GLENNYS SCHORSCH  HQP:[id number] DOB: 1955/12/28 DOA: 08/27/2022 PCP: Billie Ruddy, MD  Chief Complaint  Patient presents with   Fall    Brief Narrative:   Sherry Harrison is a 66 y.o. female with medical history significant of spinal stenosis, chronic pain, hypertension, cancer, osteoporosis presenting after a fall.   Hip x-ray showed mild displaced right femoral neck fracture, right hand x-ray showed minimally displaced first distal phalangeal fracture, right knee x-ray showed no acute normality, CT head showed no acute abnormality.   Assessment & Plan:   Principal Problem:   Closed displaced fracture of right femoral neck (HCC) Active Problems:   Low back pain   Spinal stenosis, lumbar region, with neurogenic claudication   Essential hypertension   Osteoporosis   Hyponatremia  Fall leading to Right minimally displaced femoral neck fracture:  - CT head without acute abnormality.  - plan for surgical repair today.  - pain control, pt requesting non narcotic pain meds.  - IV toradol prn.  - IV zofran.   Hyponatremia: Much improved  with fluids.  Probably from [place].  Continue to monitor.    Hypertension:  BP parameters are optimal.   Leukocytosis  Resolved.   Right first digit fracture: Minimally displaced first distal phalangeal fracture. Pain control.   Chronic low back pain from spinal stenosis.  Therapy eval in am.    DVT prophylaxis: lovenox to start from tomorrow.  Code Status: Full code.  Family Communication: family at bedside.  Disposition:   Status is: Inpatient Remains inpatient appropriate because: OR today.    Level of care: Med-Surg Consultants:  Orthopedics.   Procedures: surgical repair.  Antimicrobials:   Subjective: No chest pain or sob. Slightly nauseated from pain meds.   Objective: Vitals:   08/28/22 0518 08/28/22 0929 08/28/22 1323 08/28/22 1325  BP: (!) 121/56 108/66  (!) 140/61  Pulse: 91 77  95   Resp: '17 17  18  '$ Temp: 98.4 F (36.9 C) 98.1 F (36.7 C)  98.3 F (36.8 C)  TempSrc: Oral Oral  Oral  SpO2: 97% 97%  96%  Weight:   70.9 kg   Height:        Intake/Output Summary (Last 24 hours) at 08/28/2022 1415 Last data filed at 08/28/2022 0518 Gross per 24 hour  Intake 1240 ml  Output 1000 ml  Net 240 ml   Filed Weights   08/27/22 1357 08/28/22 1323  Weight: 70.9 kg 70.9 kg    Examination:  General exam: Appears calm and comfortable  Respiratory system: Clear to auscultation. Respiratory effort normal. Cardiovascular system: S1 & S2 heard, RRR. No JVD, . No pedal edema. Gastrointestinal system: Abdomen is nondistended, soft and nontender.  Normal bowel sounds heard. Central nervous system: Alert and oriented. No focal neurological deficits. Extremities: painful ROM of the right leg.  Skin: No rashes Psychiatry:  Mood & affect appropriate.     Data Reviewed: I have personally reviewed following labs and imaging studies  CBC: Recent Labs  Lab 08/27/22 1612 08/28/22 0356  WBC 14.3* 10.4  NEUTROABS 11.9*  --   HGB 13.6 12.8  HCT 38.2 37.3  MCV 89.7 94.0  PLT 304 599    Basic Metabolic Panel: Recent Labs  Lab 08/27/22 1612 08/27/22 2211 08/28/22 0356  NA 125* 130* 133*  K 3.5 3.9 4.5  CL 88* 98 97*  CO2 '25 25 29  '$ GLUCOSE 121* 147* 143*  BUN '12 13 12  '$ CREATININE  0.86 0.89 0.80  CALCIUM 9.5 9.2 9.0    GFR: Estimated Creatinine Clearance: 69.8 mL/min (by C-G formula based on SCr of 0.8 mg/dL).  Liver Function Tests: No results for input(s): "AST", "ALT", "ALKPHOS", "BILITOT", "PROT", "ALBUMIN" in the last 168 hours.  CBG: No results for input(s): "GLUCAP" in the last 168 hours.   Recent Results (from the past 240 hour(s))  Surgical PCR screen     Status: None   Collection Time: 08/28/22  4:29 AM   Specimen: Nasal Mucosa; Nasal Swab  Result Value Ref Range Status   MRSA, PCR NEGATIVE NEGATIVE Final   Staphylococcus aureus NEGATIVE  NEGATIVE Final    Comment: (NOTE) The Xpert SA Assay (FDA approved for NASAL specimens in patients 38 years of age and older), is one component of a comprehensive surveillance program. It is not intended to diagnose infection nor to guide or monitor treatment. Performed at [facility], [place] [address], [place], [place] [address]          Radiology Studies: CT Head Wo Contrast  Result Date: 08/27/2022 CLINICAL DATA:  Head trauma, minor (Age >= 65y). Fall when playing tennis EXAM: CT HEAD WITHOUT CONTRAST TECHNIQUE: Contiguous axial images were obtained from the base of the skull through the vertex without intravenous contrast. RADIATION DOSE REDUCTION: This exam was performed according to the departmental dose-optimization program which includes automated exposure control, adjustment of the mA and/or kV according to patient size and/or use of iterative reconstruction technique. COMPARISON:  None Available. FINDINGS: Brain: No acute intracranial abnormality. Specifically, no hemorrhage, hydrocephalus, mass lesion, acute infarction, or significant intracranial injury. Vascular: No hyperdense vessel or unexpected calcification. Skull: No acute calvarial abnormality. Sinuses/Orbits: No acute findings Other: None IMPRESSION: Normal study. Electronically Signed   By: Rolm Baptise M.D.   On: 08/27/2022 18:01   DG Knee Complete 4 Views Right  Result Date: 08/27/2022 CLINICAL DATA:  Fall EXAM: RIGHT KNEE - COMPLETE 4 VIEW COMPARISON:  None Available. FINDINGS: No evidence of fracture, dislocation, or joint effusion. No evidence of arthropathy or other focal bone abnormality. Soft tissues are unremarkable. IMPRESSION: No acute osseous abnormality. Electronically Signed   By: Yetta Glassman M.D.   On: 08/27/2022 16:13   DG Hand Complete Right  Result Date: 08/27/2022 CLINICAL DATA:  Right hand pain after fall. EXAM: RIGHT HAND - COMPLETE 3+ VIEW COMPARISON:  None Available.  FINDINGS: Minimally displaced fracture is seen involving the first distal phalanx. No other fracture or dislocation is noted. Joint spaces are unremarkable. IMPRESSION: Minimally displaced first distal phalangeal fracture. Electronically Signed   By: Marijo Conception M.D.   On: 08/27/2022 15:12   DG Hip Unilat  With Pelvis 2-3 Views Right  Result Date: 08/27/2022 CLINICAL DATA:  Right hip pain after fall. EXAM: DG HIP (WITH OR WITHOUT PELVIS) 2-3V RIGHT COMPARISON:  December 22, 2020. FINDINGS: Mildly displaced fracture is seen involving the proximal right femoral neck. Status post left hip arthroplasty. IMPRESSION: Mildly displaced proximal right femoral neck fracture. Electronically Signed   By: Marijo Conception M.D.   On: 08/27/2022 15:11        Scheduled Meds:  [MAR Hold] lisinopril  10 mg Oral Daily   tranexamic acid (CYKLOKAPRON) 2,000 mg in sodium chloride 0.9 % 50 mL Topical Application  4,765 mg Topical To OR   Continuous Infusions:   ceFAZolin (ANCEF) IV     lactated ringers 50 mL/hr at 08/28/22 1344   tranexamic acid  LOS: 1 day       Hosie Poisson, MD [facility]   To contact the attending provider between 7A-7P or the covering provider during after hours 7P-7A, please log into the web site [email or web address] and access using universal [place] password for that web site. If you do not have the password, please call the hospital operator.  08/28/2022, 2:15 PM

## 2022-08-28 NOTE — Consult Note (Signed)
Sherry Nakayama, MD  Sherry Dolly, PA-C                                  [facility]                [address] [place], [place]  [address]   [place]            MRN:  [id number] DOB/SEX:  04/24/1956/female     CHIEF COMPLAINT:  Painful right hip  HISTORY: Sherry Harrison a 66 y.o. female with recent fall on tennis court.  Admitted to medicine with hip fracture and thumb fracture.   PAST MEDICAL HISTORY: Patient Active Problem List   Diagnosis Date Noted   Closed displaced fracture of right femoral neck ([place]) 08/27/2022   Hyponatremia 08/27/2022   Essential hypertension 04/24/2021   Arthritis of left hip 03/23/2021   Spinal stenosis, lumbar region, with neurogenic claudication 03/23/2021   Low back pain 12/23/2020   Osteoporosis 05/21/2016   Past Medical History:  Diagnosis Date   Cancer ([place])    Hypertension    Past Surgical History:  Procedure Laterality Date   HIP SURGERY     HIP SURGERY Left    TONSILLECTOMY       MEDICATIONS:   Current Facility-Administered Medications:    HYDROcodone-acetaminophen (NORCO/VICODIN) 5-325 MG per tablet 1 tablet, 1 tablet, Oral, Q6H PRN, Marcelyn Bruins, MD   HYDROmorphone (DILAUDID) injection 0.5 mg, 0.5 mg, Intravenous, Q3H PRN, Marcelyn Bruins, MD, 0.5 mg at 08/28/22 5102   lisinopril (ZESTRIL) tablet 10 mg, 10 mg, Oral, Daily, Marcelyn Bruins, MD   polyethylene glycol (MIRALAX / GLYCOLAX) packet 17 g, 17 g, Oral, Daily PRN, Marcelyn Bruins, MD  ALLERGIES:  No Known Allergies  REVIEW OF SYSTEMS: REVIEWED IN DETAIL IN CHART  FAMILY HISTORY:   Family History  Problem Relation Age of Onset   Breast cancer Mother        pagets disease of nipple    SOCIAL HISTORY:   Social History   Tobacco Use   Smoking status: Former    Types: Cigarettes    Start date: 1975    Quit date: 1980    Years since quitting: 43.7   Smokeless tobacco: Never  Substance Use  Topics   Alcohol use: Yes     EXAMINATION: Vital signs in last 24 hours: Temp:  [97.5 F (36.4 C)-99 F (37.2 C)] 98.4 F (36.9 C) (09/26 0518) Pulse Rate:  [75-103] 91 (09/26 0518) Resp:  [12-18] 17 (09/26 0518) BP: (121-146)/(56-87) 121/56 (09/26 0518) SpO2:  [97 %-100 %] 97 % (09/26 0518) Weight:  [70.9 kg] 70.9 kg (09/25 1357)  BP (!) 121/56 (BP Location: Left Arm)   Pulse 91   Temp 98.4 F (36.9 C) (Oral)   Resp 17   Ht '5\' 8"'$  (1.727 m)   Wt 70.9 kg   SpO2 97%   BMI 23.77 kg/m   General Appearance:    Alert, cooperative, no distress, appears stated age  Head:    Normocephalic, without obvious abnormality, atraumatic  Eyes:    PERRL, conjunctiva/corneas clear, EOM's intact, fundi    benign, both eyes  Ears:    Normal TM's and external ear canals, both ears  Nose:   Nares normal, septum midline, mucosa normal, no drainage    or sinus tenderness  Throat:   Lips, mucosa, and tongue  normal; teeth and gums normal  Neck:   Supple, symmetrical, trachea midline, no adenopathy;    thyroid:  no enlargement/tenderness/nodules; no carotid   bruit or JVD  Back:     Symmetric, no curvature, ROM normal, no CVA tenderness  Lungs:     Clear to auscultation bilaterally, respirations unlabored  Chest Wall:    No tenderness or deformity   Heart:    Regular rate and rhythm, S1 and S2 normal, no murmur, rub   or gallop  Breast Exam:    No tenderness, masses, or nipple abnormality  Abdomen:     Soft, non-tender, bowel sounds active all four quadrants,    no masses, no organomegaly  Genitalia:    Normal female without lesion, discharge or tenderness  Rectal:    Normal tone, normal prostate, no masses or tenderness;   guaiac negative stool  Extremities:   Extremities normal, atraumatic, no cyanosis or edema  Pulses:   2+ and symmetric all extremities  Skin:   Skin color, texture, turgor normal, no rashes or lesions  Lymph nodes:   Cervical, supraclavicular, and axillary nodes normal   Neurologic:   CNII-XII intact, normal strength, sensation and reflexes    throughout    Musculoskeletal Exam:   painful rotation of right hip, thumb splinted   DIAGNOSTIC STUDIES: Recent laboratory studies: Recent Labs    08/27/22 1612 08/28/22 0356  WBC 14.3* 10.4  HGB 13.6 12.8  HCT 38.2 37.3  PLT 304 263   Recent Labs    08/27/22 1612 08/27/22 2211 08/28/22 0356  NA 125* 130* 133*  K 3.5 3.9 4.5  CL 88* 98 97*  CO2 '25 25 29  '$ BUN '12 13 12  '$ CREATININE 0.86 0.89 0.80  GLUCOSE 121* 147* 143*  CALCIUM 9.5 9.2 9.0   Lab Results  Component Value Date   INR 1.0 09/14/2021     Recent Radiographic Studies :  CT Head Wo Contrast  Result Date: 08/27/2022 CLINICAL DATA:  Head trauma, minor (Age >= 65y). Fall when playing tennis EXAM: CT HEAD WITHOUT CONTRAST TECHNIQUE: Contiguous axial images were obtained from the base of the skull through the vertex without intravenous contrast. RADIATION DOSE REDUCTION: This exam was performed according to the departmental dose-optimization program which includes automated exposure control, adjustment of the mA and/or kV according to patient size and/or use of iterative reconstruction technique. COMPARISON:  None Available. FINDINGS: Brain: No acute intracranial abnormality. Specifically, no hemorrhage, hydrocephalus, mass lesion, acute infarction, or significant intracranial injury. Vascular: No hyperdense vessel or unexpected calcification. Skull: No acute calvarial abnormality. Sinuses/Orbits: No acute findings Other: None IMPRESSION: Normal study. Electronically Signed   By: Rolm Baptise M.D.   On: 08/27/2022 18:01   DG Knee Complete 4 Views Right  Result Date: 08/27/2022 CLINICAL DATA:  Fall EXAM: RIGHT KNEE - COMPLETE 4 VIEW COMPARISON:  None Available. FINDINGS: No evidence of fracture, dislocation, or joint effusion. No evidence of arthropathy or other focal bone abnormality. Soft tissues are unremarkable. IMPRESSION: No acute osseous  abnormality. Electronically Signed   By: Yetta Glassman M.D.   On: 08/27/2022 16:13   DG Hand Complete Right  Result Date: 08/27/2022 CLINICAL DATA:  Right hand pain after fall. EXAM: RIGHT HAND - COMPLETE 3+ VIEW COMPARISON:  None Available. FINDINGS: Minimally displaced fracture is seen involving the first distal phalanx. No other fracture or dislocation is noted. Joint spaces are unremarkable. IMPRESSION: Minimally displaced first distal phalangeal fracture. Electronically Signed   By: Sabino Dick  Jr M.D.   On: 08/27/2022 15:12   DG Hip Unilat  With Pelvis 2-3 Views Right  Result Date: 08/27/2022 CLINICAL DATA:  Right hip pain after fall. EXAM: DG HIP (WITH OR WITHOUT PELVIS) 2-3V RIGHT COMPARISON:  December 22, 2020. FINDINGS: Mildly displaced fracture is seen involving the proximal right femoral neck. Status post left hip arthroplasty. IMPRESSION: Mildly displaced proximal right femoral neck fracture. Electronically Signed   By: Marijo Conception M.D.   On: 08/27/2022 15:11    ASSESSMENT:  right hip displaced femoral neck fracture   PLAN:  will plan on THR anterior approach later today.  Discussed with patient and her husband all risks and benefits.   Hessie Dibble 08/28/2022, 7:21 AM

## 2022-08-28 NOTE — Transfer of Care (Signed)
Immediate Anesthesia Transfer of Care Note  Patient: Sherry Harrison  Procedure(s) Performed: RIGHT TOTAL HIP ARTHROPLASTY ANTERIOR APPROACH (Right: Hip)  Patient Location: PACU  Anesthesia Type:Spinal  Level of Consciousness: awake, sedated and responds to stimulation  Airway & Oxygen Therapy: Patient Spontanous Breathing and Patient connected to face mask oxygen  Post-op Assessment: Report given to RN and Post -op Vital signs reviewed and stable  Post vital signs: Reviewed and stable  Last Vitals:  Vitals Value Taken Time  BP 112/64 08/28/22 1617  Temp    Pulse 102 08/28/22 1620  Resp 21 08/28/22 1620  SpO2 100 % 08/28/22 1620  Vitals shown include unvalidated device data.  Last Pain:  Vitals:   08/28/22 1325  TempSrc: Oral  PainSc:       Patients Stated Pain Goal: 0 (25/95/63 8756)  Complications: No notable events documented.

## 2022-08-28 NOTE — H&P (View-Only) (Signed)
[name], MD  [name], PA-C                                  [facility]                [address] [place], [place]  [address]   [place]            MRN:  [id number] DOB/SEX:  January 27, 1956/female     CHIEF COMPLAINT:  Painful right hip  HISTORY: Sherry Harrison a 66 y.o. female with recent fall on tennis court.  Admitted to medicine with hip fracture and thumb fracture.   PAST MEDICAL HISTORY: Patient Active Problem List   Diagnosis Date Noted   Closed displaced fracture of right femoral neck ([place]) 08/27/2022   Hyponatremia 08/27/2022   Essential hypertension 04/24/2021   Arthritis of left hip 03/23/2021   Spinal stenosis, lumbar region, with neurogenic claudication 03/23/2021   Low back pain 12/23/2020   Osteoporosis 05/21/2016   Past Medical History:  Diagnosis Date   Cancer ([place])    Hypertension    Past Surgical History:  Procedure Laterality Date   HIP SURGERY     HIP SURGERY Left    TONSILLECTOMY       MEDICATIONS:   Current Facility-Administered Medications:    HYDROcodone-acetaminophen (NORCO/VICODIN) 5-325 MG per tablet 1 tablet, 1 tablet, Oral, Q6H PRN, Marcelyn Bruins, MD   HYDROmorphone (DILAUDID) injection 0.5 mg, 0.5 mg, Intravenous, Q3H PRN, Marcelyn Bruins, MD, 0.5 mg at 08/28/22 9678   lisinopril (ZESTRIL) tablet 10 mg, 10 mg, Oral, Daily, Marcelyn Bruins, MD   polyethylene glycol (MIRALAX / GLYCOLAX) packet 17 g, 17 g, Oral, Daily PRN, Marcelyn Bruins, MD  ALLERGIES:  No Known Allergies  REVIEW OF SYSTEMS: REVIEWED IN DETAIL IN CHART  FAMILY HISTORY:   Family History  Problem Relation Age of Onset   Breast cancer Mother        pagets disease of nipple    SOCIAL HISTORY:   Social History   Tobacco Use   Smoking status: Former    Types: Cigarettes    Start date: 1975    Quit date: 1980    Years since quitting: 43.7   Smokeless tobacco: Never  Substance Use  Topics   Alcohol use: Yes     EXAMINATION: Vital signs in last 24 hours: Temp:  [97.5 F (36.4 C)-99 F (37.2 C)] 98.4 F (36.9 C) (09/26 0518) Pulse Rate:  [75-103] 91 (09/26 0518) Resp:  [12-18] 17 (09/26 0518) BP: (121-146)/(56-87) 121/56 (09/26 0518) SpO2:  [97 %-100 %] 97 % (09/26 0518) Weight:  [70.9 kg] 70.9 kg (09/25 1357)  BP (!) 121/56 (BP Location: Left Arm)   Pulse 91   Temp 98.4 F (36.9 C) (Oral)   Resp 17   Ht '5\' 8"'$  (1.727 m)   Wt 70.9 kg   SpO2 97%   BMI 23.77 kg/m   General Appearance:    Alert, cooperative, no distress, appears stated age  Head:    Normocephalic, without obvious abnormality, atraumatic  Eyes:    PERRL, conjunctiva/corneas clear, EOM's intact, fundi    benign, both eyes  Ears:    Normal TM's and external ear canals, both ears  Nose:   Nares normal, septum midline, mucosa normal, no drainage    or sinus tenderness  Throat:   Lips, mucosa, and tongue  normal; teeth and gums normal  Neck:   Supple, symmetrical, trachea midline, no adenopathy;    thyroid:  no enlargement/tenderness/nodules; no carotid   bruit or JVD  Back:     Symmetric, no curvature, ROM normal, no CVA tenderness  Lungs:     Clear to auscultation bilaterally, respirations unlabored  Chest Wall:    No tenderness or deformity   Heart:    Regular rate and rhythm, S1 and S2 normal, no murmur, rub   or gallop  Breast Exam:    No tenderness, masses, or nipple abnormality  Abdomen:     Soft, non-tender, bowel sounds active all four quadrants,    no masses, no organomegaly  Genitalia:    Normal female without lesion, discharge or tenderness  Rectal:    Normal tone, normal prostate, no masses or tenderness;   guaiac negative stool  Extremities:   Extremities normal, atraumatic, no cyanosis or edema  Pulses:   2+ and symmetric all extremities  Skin:   Skin color, texture, turgor normal, no rashes or lesions  Lymph nodes:   Cervical, supraclavicular, and axillary nodes normal   Neurologic:   CNII-XII intact, normal strength, sensation and reflexes    throughout    Musculoskeletal Exam:   painful rotation of right hip, thumb splinted   DIAGNOSTIC STUDIES: Recent laboratory studies: Recent Labs    08/27/22 1612 08/28/22 0356  WBC 14.3* 10.4  HGB 13.6 12.8  HCT 38.2 37.3  PLT 304 263   Recent Labs    08/27/22 1612 08/27/22 2211 08/28/22 0356  NA 125* 130* 133*  K 3.5 3.9 4.5  CL 88* 98 97*  CO2 '25 25 29  '$ BUN '12 13 12  '$ CREATININE 0.86 0.89 0.80  GLUCOSE 121* 147* 143*  CALCIUM 9.5 9.2 9.0   Lab Results  Component Value Date   INR 1.0 09/14/2021     Recent Radiographic Studies :  CT Head Wo Contrast  Result Date: 08/27/2022 CLINICAL DATA:  Head trauma, minor (Age >= 65y). Fall when playing tennis EXAM: CT HEAD WITHOUT CONTRAST TECHNIQUE: Contiguous axial images were obtained from the base of the skull through the vertex without intravenous contrast. RADIATION DOSE REDUCTION: This exam was performed according to the departmental dose-optimization program which includes automated exposure control, adjustment of the mA and/or kV according to patient size and/or use of iterative reconstruction technique. COMPARISON:  None Available. FINDINGS: Brain: No acute intracranial abnormality. Specifically, no hemorrhage, hydrocephalus, mass lesion, acute infarction, or significant intracranial injury. Vascular: No hyperdense vessel or unexpected calcification. Skull: No acute calvarial abnormality. Sinuses/Orbits: No acute findings Other: None IMPRESSION: Normal study. Electronically Signed   By: Rolm Baptise M.D.   On: 08/27/2022 18:01   DG Knee Complete 4 Views Right  Result Date: 08/27/2022 CLINICAL DATA:  Fall EXAM: RIGHT KNEE - COMPLETE 4 VIEW COMPARISON:  None Available. FINDINGS: No evidence of fracture, dislocation, or joint effusion. No evidence of arthropathy or other focal bone abnormality. Soft tissues are unremarkable. IMPRESSION: No acute osseous  abnormality. Electronically Signed   By: Yetta Glassman M.D.   On: 08/27/2022 16:13   DG Hand Complete Right  Result Date: 08/27/2022 CLINICAL DATA:  Right hand pain after fall. EXAM: RIGHT HAND - COMPLETE 3+ VIEW COMPARISON:  None Available. FINDINGS: Minimally displaced fracture is seen involving the first distal phalanx. No other fracture or dislocation is noted. Joint spaces are unremarkable. IMPRESSION: Minimally displaced first distal phalangeal fracture. Electronically Signed   By: Sabino Dick  Jr M.D.   On: 08/27/2022 15:12   DG Hip Unilat  With Pelvis 2-3 Views Right  Result Date: 08/27/2022 CLINICAL DATA:  Right hip pain after fall. EXAM: DG HIP (WITH OR WITHOUT PELVIS) 2-3V RIGHT COMPARISON:  December 22, 2020. FINDINGS: Mildly displaced fracture is seen involving the proximal right femoral neck. Status post left hip arthroplasty. IMPRESSION: Mildly displaced proximal right femoral neck fracture. Electronically Signed   By: Marijo Conception M.D.   On: 08/27/2022 15:11    ASSESSMENT:  right hip displaced femoral neck fracture   PLAN:  will plan on THR anterior approach later today.  Discussed with patient and her husband all risks and benefits.   Hessie Dibble 08/28/2022, 7:21 AM

## 2022-08-28 NOTE — Interval H&P Note (Signed)
History and Physical Interval Note:  08/28/2022 1:53 PM  Sherry Harrison  has presented today for surgery, with the diagnosis of RIGHT HIP FRACTURE.  The various methods of treatment have been discussed with the patient and family. After consideration of risks, benefits and other options for treatment, the patient has consented to  Procedure(s): RIGHT TOTAL HIP ARTHROPLASTY ANTERIOR APPROACH (Right) as a surgical intervention.  The patient's history has been reviewed, patient examined, no change in status, stable for surgery.  I have reviewed the patient's chart and labs.  Questions were answered to the patient's satisfaction.     Hessie Dibble

## 2022-08-28 NOTE — Anesthesia Procedure Notes (Signed)
Procedure Name: MAC Date/Time: 08/28/2022 2:39 PM  Performed by: Lollie Sails, CRNAPre-anesthesia Checklist: Patient identified, Emergency Drugs available, Suction available, Patient being monitored and Timeout performed Oxygen Delivery Method: Simple face mask Placement Confirmation: positive ETCO2

## 2022-08-28 NOTE — Op Note (Signed)
PRE-OP DIAGNOSIS:  RIGHT HIP femoral neck fracture POST-OP DIAGNOSIS:  same PROCEDURE: RIGHT TOTAL HIP ARTHROPLASTY ANTERIOR APPROACH ANESTHESIA:  Spinal and MAC SURGEON:  [name] MD ASSISTANT:  [name] PA-C   INDICATIONS FOR PROCEDURE:  The patient is a 66 y.o. female with a recent history of a painful hip.  This started yesterday with a fall on a tennis court..  She suffered a displaced femoral neck fracture.  A total hip replacement is offered as surgical treatment.  Informed operative consent was obtained after discussion of possible complications including reaction to anesthesia, infection, neurovascular injury, dislocation, DVT, PE, and death.  The importance of the postoperative rehab program to optimize result was stressed with the patient.  SUMMARY OF FINDINGS AND PROCEDURE:  Under the above anesthesia through a anterior approach an the Hana table a right THR was performed.  The patient had a displaced femoral neck fracture and good bone quality.  We used DePuy components to replace the hip and these were size 9 Actis femur capped with a +1.5 3m ceramic hip ball.  On the acetabular side we used a size 52 Gription shell with a  plus 4 neutral polyethylene liner.  We did use a hole eliminator.  ALoni DollyPA-C assisted throughout and was invaluable to the completion of the case in that he helped position and retract while I performed the procedure.  He also closed simultaneously to help minimize OR time.  I used fluoroscopy throughout the case to check position of implants and leg lengths and read all of these views myself.  DESCRIPTION OF PROCEDURE:  The patient was taken to the OR suite where the above anesthetic was applied.  The patient was then positioned on the Hana table supine.  All bony prominences were appropriately padded.  Prep and drape was then performed in normal sterile fashion.  The patient was given kefzol preoperative antibiotic and an appropriate time out was  performed.  We then took an anterior approach to the right hip.  Dissection was taken through adipose to the tensor fascia lata fascia.  This structure was incised longitudinally and we dissected in the intermuscular interval just medial to this muscle.  Cobra retractors were placed superior and inferior to the femoral neck superficial to the capsule.  A capsular incision was then made and the retractors were placed along the femoral neck.  Xray was brought in to get a good level for the femoral neck cut which was made with an oscillating saw and osteotome.  The femoral head was removed with a corkscrew.  The acetabulum was exposed and some labral tissues were excised. Reaming was taken to the inside wall of the pelvis and sequentially up to 1 mm smaller than the actual component.  A trial of components was done and then the aforementioned acetabular shell was placed in appropriate tilt and anteversion confirmed by fluoroscopy. The liner was placed along with the hole eliminator and attention was turned to the femur.  The leg was brought down and over into adduction and the elevator bar was used to raise the femur up gently in the wound.  The piriformis was released with care taken to preserve the obturator internus attachment and all of the posterior capsule. The femur was reamed and then broached to the appropriate size.  A trial reduction was done and the aforementioned head and neck assembly gave uKoreathe best stability in extension with external rotation.  Leg lengths were felt to be about equal by  fluoroscopic exam.  The trial components were removed and the wound irrigated.  We then placed the femoral component in appropriate anteversion.  The head was applied to a dry stem neck and the hip again reduced.  It was again stable in the aforementioned position.  The would was irrigated again followed by re-approximation of anterior capsule with ethibond suture. Tensor fascia was repaired with V-loc suture  followed  by deep closure with #O and #2 undyed vicryl.  Skin was closed with subQ stitch and steristrips followed by a sterile dressing.  EBL and IOF can be obtained from anesthesia records.  DISPOSITION:  The patient was taken to PACU in stable condition to potentially go home same day depending on ability to walk and tolerate liquids.

## 2022-08-28 NOTE — Anesthesia Preprocedure Evaluation (Addendum)
Anesthesia Evaluation  Patient identified by MRN, date of birth, ID band Patient awake    Reviewed: Allergy & Precautions, NPO status , Patient's Chart, lab work & pertinent test results  Airway Mallampati: II  TM Distance: >3 FB Neck ROM: Full    Dental no notable dental hx.    Pulmonary former smoker,    Pulmonary exam normal        Cardiovascular hypertension, Pt. on medications Normal cardiovascular exam     Neuro/Psych negative neurological ROS  negative psych ROS   GI/Hepatic negative GI ROS, Neg liver ROS,   Endo/Other  negative endocrine ROS  Renal/GU negative Renal ROS     Musculoskeletal  (+) Arthritis ,   Abdominal   Peds  Hematology negative hematology ROS (+)   Anesthesia Other Findings RIGHT HIP FRACTURE  Reproductive/Obstetrics                            Anesthesia Physical Anesthesia Plan  ASA: 2  Anesthesia Plan: Spinal   Post-op Pain Management:    Induction: Intravenous  PONV Risk Score and Plan: 2 and Ondansetron, Dexamethasone, Propofol infusion, Midazolam and Treatment may vary due to age or medical condition  Airway Management Planned: Simple Face Mask  Additional Equipment:   Intra-op Plan:   Post-operative Plan:   Informed Consent: I have reviewed the patients History and Physical, chart, labs and discussed the procedure including the risks, benefits and alternatives for the proposed anesthesia with the patient or authorized representative who has indicated his/her understanding and acceptance.     Dental advisory given  Plan Discussed with: CRNA  Anesthesia Plan Comments:         Anesthesia Quick Evaluation

## 2022-08-29 ENCOUNTER — Encounter (HOSPITAL_COMMUNITY): Payer: Self-pay | Admitting: Orthopaedic Surgery

## 2022-08-29 DIAGNOSIS — S72001A Fracture of unspecified part of neck of right femur, initial encounter for closed fracture: Secondary | ICD-10-CM | POA: Diagnosis not present

## 2022-08-29 MED ORDER — TRAMADOL HCL 50 MG PO TABS: 50.0000 mg | ORAL_TABLET | Freq: Four times a day (QID) | ORAL | Status: DC | PRN

## 2022-08-29 MED ORDER — LISINOPRIL 10 MG PO TABS: 10.0000 mg | ORAL_TABLET | Freq: Every day | ORAL | 11 refills | Status: DC

## 2022-08-29 MED ORDER — ASPIRIN 81 MG PO CHEW: 81.0000 mg | CHEWABLE_TABLET | Freq: Two times a day (BID) | ORAL | Status: DC

## 2022-08-29 NOTE — Anesthesia Postprocedure Evaluation (Signed)
Anesthesia Post Note  Patient: ELEESHA PURKEY  Procedure(s) Performed: RIGHT TOTAL HIP ARTHROPLASTY ANTERIOR APPROACH (Right: Hip)     Patient location during evaluation: Other Anesthesia Type: Spinal Level of consciousness: oriented and awake and alert Pain management: pain level controlled Vital Signs Assessment: post-procedure vital signs reviewed and stable Respiratory status: spontaneous breathing, respiratory function stable and patient connected to nasal cannula oxygen Cardiovascular status: blood pressure returned to baseline and stable Postop Assessment: no headache, no backache, no apparent nausea or vomiting, spinal receding and patient able to bend at knees Anesthetic complications: no   No notable events documented.  Last Vitals:  Vitals:   08/29/22 0115 08/29/22 0559  BP: 103/63 139/66  Pulse: 74 70  Resp: 17 17  Temp: 36.7 C 36.7 C  SpO2: 98% 99%    Last Pain:  Vitals:   08/29/22 0559  TempSrc: Oral  PainSc:                  Kilani Joffe,W. EDMOND

## 2022-08-29 NOTE — Evaluation (Signed)
Physical Therapy Evaluation Patient Details Name: Sherry Harrison MRN: [id number] DOB: [birth year] Today's Date: 08/29/2022  History of Present Illness  66 yo female admitted with displaced R femoral neck fx. S/P R THA-DA 9/26. Hx of L THA.  Clinical Impression  On eval, pt was Min guard A for mobility. She walked ~135 feet with a RW and ascended/descended 5 stairs with use of railing. Pt reports minimal pain. She tolerated activity well. Husband was present during session. Discussed d/c plan-she will return home where she lives with her husband. She would like to have OP PT f/u. All education completed. Okay to d/c from PT standpoint.      Recommendations for follow up therapy are one component of a multi-disciplinary discharge planning process, led by the attending physician.  Recommendations may be updated based on patient status, additional functional criteria and insurance authorization.  Follow Up Recommendations Outpatient PT      Assistance Recommended at Discharge Intermittent Supervision/Assistance  Patient can return home with the following  Assist for transportation;Assistance with cooking/housework;A little help with bathing/dressing/bathroom    Equipment Recommendations None recommended by PT  Recommendations for Other Services       Functional Status Assessment Patient has had a recent decline in their functional status and demonstrates the ability to make significant improvements in function in a reasonable and predictable amount of time.     Precautions / Restrictions Precautions Precautions: Fall Restrictions Weight Bearing Restrictions: No Other Position/Activity Restrictions: WBAT      Mobility  Bed Mobility Overal bed mobility: Needs Assistance Bed Mobility: Supine to Sit, Sit to Supine     Supine to sit: Min guard, HOB elevated Sit to supine: Min guard, HOB elevated   General bed mobility comments: Pt used UE to assist R LE on/off bed. Increased  time.    Transfers Overall transfer level: Needs assistance Equipment used: Rolling walker (2 wheels) Transfers: Sit to/from Stand Sit to Stand: Supervision           General transfer comment: Supv for safety, hand placement.    Ambulation/Gait Ambulation/Gait assistance: Min guard Gait Distance (Feet): 135 Feet Assistive device: Rolling walker (2 wheels) Gait Pattern/deviations: Step-to pattern       General Gait Details: Min guard for safety. Slow but steady gait. Pt denied dizziness.  Stairs Stairs: Yes Stairs assistance: Min guard Stair Management: Step to pattern, Forwards Number of Stairs: 5 General stair comments: up and over portable stairs x 2. cues for safety, sequence, technique. min guard for safety. Practiced x 1 with 2 rails and x 1 with 1 rail  Wheelchair Mobility    Modified Rankin (Stroke Patients Only)       Balance Overall balance assessment: Needs assistance         Standing balance support: Bilateral upper extremity supported, During functional activity, Reliant on assistive device for balance Standing balance-Leahy Scale: Fair                               Pertinent Vitals/Pain Pain Assessment Pain Assessment: Faces Faces Pain Scale: Hurts little more Pain Location: R hip/LE Pain Descriptors / Indicators: Discomfort, Sore Pain Intervention(s): Monitored during session, Repositioned, Ice applied    Home Living Family/patient expects to be discharged to:: Private residence Living Arrangements: Spouse/significant other Available Help at Discharge: Family Type of Home: House Home Access: Stairs to enter Entrance Stairs-Rails: Psychiatric nurse of Steps: 3 Alternate Level Stairs-Number of  Steps: 1 flight Home Layout: Two level Home Equipment: Rolling Walker (2 wheels);Cane - single point;BSC/3in1      Prior Function Prior Level of Function : Independent/Modified Independent                      Hand Dominance        Extremity/Trunk Assessment   Upper Extremity Assessment Upper Extremity Assessment: Overall WFL for tasks assessed    Lower Extremity Assessment Lower Extremity Assessment: Generalized weakness    Cervical / Trunk Assessment Cervical / Trunk Assessment: Normal  Communication   Communication: No difficulties  Cognition Arousal/Alertness: Awake/alert Behavior During Therapy: WFL for tasks assessed/performed Overall Cognitive Status: Within Functional Limits for tasks assessed                                          General Comments      Exercises     Assessment/Plan    PT Assessment Patient needs continued PT services  PT Problem List Decreased strength;Decreased mobility;Decreased range of motion;Decreased activity tolerance;Decreased balance;Decreased knowledge of use of DME;Pain       PT Treatment Interventions DME instruction;Gait training;Therapeutic activities;Therapeutic exercise;Patient/family education;Functional mobility training;Balance training    PT Goals (Current goals can be found in the Care Plan section)  Acute Rehab PT Goals Patient Stated Goal: regain independence/PLOF PT Goal Formulation: With patient/family Time For Goal Achievement: 09/12/22 Potential to Achieve Goals: Good    Frequency 7X/week     Co-evaluation               AM-PAC PT "6 Clicks" Mobility  Outcome Measure Help needed turning from your back to your side while in a flat bed without using bedrails?: A Little Help needed moving from lying on your back to sitting on the side of a flat bed without using bedrails?: A Little Help needed moving to and from a bed to a chair (including a wheelchair)?: None Help needed standing up from a chair using your arms (e.g., wheelchair or bedside chair)?: None Help needed to walk in hospital room?: A Little Help needed climbing 3-5 steps with a railing? : A Little 6 Click Score: 20    End  of Session Equipment Utilized During Treatment: Gait belt Activity Tolerance: Patient tolerated treatment well Patient left: in bed;with call bell/phone within reach;with family/visitor present   PT Visit Diagnosis: Other abnormalities of gait and mobility (R26.89);Pain Pain - Right/Left: Right Pain - part of body: Hip;Leg    Time: 3875-6433 PT Time Calculation (min) (ACUTE ONLY): 21 min   Charges:   PT Evaluation $PT Eval Low Complexity: [place], PT Acute Rehabilitation  Office: [phone] Pager: [phone]

## 2022-08-29 NOTE — TOC Transition Note (Signed)
Transition of Care [facility]) - CM/SW Discharge Note   Patient Details  Name: Sherry Harrison MRN: [id number] Date of Birth: 1956-06-14  Transition of Care [facility]) CM/SW Contact:  Lennart Pall, LCSW Phone Number: 08/29/2022, 1:43 PM   Clinical Narrative:    Met with pt and spouse and confirming she has all needed DME at home.  Pt plans to have OPPT at ortho MD office where she has been seen after prior surgeries.  No further TOC needs.   Final next level of care: OP Rehab Barriers to Discharge: No Barriers Identified   Patient Goals and CMS Choice Patient states their goals for this hospitalization and ongoing recovery are:: return home      Discharge Placement                       Discharge Plan and Services                DME Arranged: N/A DME Agency: NA                  Social Determinants of Health (SDOH) Interventions Housing Interventions: Intervention Not Indicated   Readmission Risk Interventions     No data to display

## 2022-08-29 NOTE — Discharge Summary (Signed)
Physician Discharge Summary  Sherry Harrison:[id number] DOB: 01-04-1956 DOA: 08/27/2022  PCP: Billie Ruddy, MD  Admit date: 08/27/2022 Discharge date: 08/29/2022  Admitted From: Home Disposition: Home with outpatient therapies  Recommendations for Outpatient Follow-up:  Follow up with PCP in 1-2 weeks Follow-up with orthopedics as previously scheduled  Home Health: N/A Equipment/Devices: Available at home  Discharge Condition: Stable CODE STATUS: Full code Diet recommendation: Low-salt diet  Discharge summary: 66 year old with history of hypertension and osteoporosis presented with fall and found to have right femoral neck fracture, minimally displaced first distal phalangeal fracture.  She was admitted to the hospital.  Underwent right total hip replacement with Dr. Rhona Raider 9/26.  Postop day 1.  Appropriately improving.  Worked with PT OT.  Plan: Pain management with Tylenol and tramadol DVT prophylaxis aspirin 81 mg twice daily for 4 weeks Surgical management as per surgery Successful voiding trial, cleared by physical therapy, she will do outpatient physical therapy  Hyponatremia: Probably secondary to hydrochlorothiazide use.  Sodium 125 -spontaneously improved to 133.  We will discontinue lisinopril hydrochlorothiazide, will prescribe only lisinopril 10 mg daily. Stable for discharge.  Discharge Diagnoses:  Principal Problem:   Closed displaced fracture of right femoral neck (HCC) Active Problems:   Low back pain   Spinal stenosis, lumbar region, with neurogenic claudication   Essential hypertension   Osteoporosis   Hyponatremia    Discharge Instructions  Discharge Instructions     Call MD for:  redness, tenderness, or signs of infection (pain, swelling, redness, odor or green/yellow discharge around incision site)   Complete by: As directed    Call MD for:  severe uncontrolled pain   Complete by: As directed    Call MD for:  temperature >100.4    Complete by: As directed    Diet - low sodium heart healthy   Complete by: As directed    Increase activity slowly   Complete by: As directed    Leave dressing on - Keep it clean, dry, and intact until clinic visit   Complete by: As directed       Allergies as of 08/29/2022       Reactions   Codeine Nausea Only   Xanax [alprazolam] Other (See Comments)   Causes hyperness        Medication List     STOP taking these medications    lisinopril-hydrochlorothiazide 10-12.5 MG tablet Commonly known as: ZESTORETIC       TAKE these medications    acetaminophen 500 MG tablet Commonly known as: TYLENOL Take 1,000 mg by mouth every 6 (six) hours as needed for mild pain or moderate pain.   aspirin 81 MG chewable tablet Chew 1 tablet (81 mg total) by mouth 2 (two) times daily.   CALCIUM 600-D PO Take 600 mg by mouth daily.   lisinopril 10 MG tablet Commonly known as: ZESTRIL Take 1 tablet (10 mg total) by mouth daily.   traMADol 50 MG tablet Commonly known as: ULTRAM Take 1 tablet (50 mg total) by mouth every 6 (six) hours as needed for moderate pain or severe pain (post op pain).               Durable Medical Equipment  (From admission, onward)           Start     Ordered   08/28/22 1736  DME Walker rolling  Once       Question:  Patient needs a walker to treat with the following  condition  Answer:  Primary osteoarthritis of right hip   08/28/22 1735   08/28/22 1736  DME 3 n 1  Once        08/28/22 1735   08/28/22 1736  DME Bedside commode  Once       Question:  Patient needs a bedside commode to treat with the following condition  Answer:  Primary osteoarthritis of right hip   08/28/22 1735              Discharge Care Instructions  (From admission, onward)           Start     Ordered   08/29/22 0000  Leave dressing on - Keep it clean, dry, and intact until clinic visit        08/29/22 1302            Follow-up Information      [name], MD. Schedule an appointment as soon as possible for a visit in 2 week(s).   Specialty: Orthopedic Surgery Contact information: [place] [address] [phone]                Allergies  Allergen Reactions   Codeine Nausea Only   Xanax [Alprazolam] Other (See Comments)    Causes hyperness    Consultations: Orthopedics   Procedures/Studies: DG HIP UNILAT WITH PELVIS 1V RIGHT  Result Date: 08/28/2022 CLINICAL DATA:  Intraoperative right hip arthroplasty EXAM: DG HIP (WITH OR WITHOUT PELVIS) 1V RIGHT COMPARISON:  Right hip x-ray 08/27/2022 FINDINGS: Intraoperative right hip . Eleven low resolution intraoperative spot views of the right hip were obtained. Right hip total arthroplasty present in anatomic alignment. No fracture visible on the limited views. Total fluoroscopy time: 13 seconds Total radiation dose: 1.34 micro Gy IMPRESSION: Intraoperative right hip arthroplasty. Electronically Signed   By: Ronney Asters M.D.   On: 08/28/2022 16:14   DG C-Arm 1-60 Min-No Report  Result Date: 08/28/2022 Fluoroscopy was utilized by the requesting physician.  No radiographic interpretation.   CT Head Wo Contrast  Result Date: 08/27/2022 CLINICAL DATA:  Head trauma, minor (Age >= 65y). Fall when playing tennis EXAM: CT HEAD WITHOUT CONTRAST TECHNIQUE: Contiguous axial images were obtained from the base of the skull through the vertex without intravenous contrast. RADIATION DOSE REDUCTION: This exam was performed according to the departmental dose-optimization program which includes automated exposure control, adjustment of the mA and/or kV according to patient size and/or use of iterative reconstruction technique. COMPARISON:  None Available. FINDINGS: Brain: No acute intracranial abnormality. Specifically, no hemorrhage, hydrocephalus, mass lesion, acute infarction, or significant intracranial injury. Vascular: No hyperdense vessel or unexpected  calcification. Skull: No acute calvarial abnormality. Sinuses/Orbits: No acute findings Other: None IMPRESSION: Normal study. Electronically Signed   By: Rolm Baptise M.D.   On: 08/27/2022 18:01   DG Knee Complete 4 Views Right  Result Date: 08/27/2022 CLINICAL DATA:  Fall EXAM: RIGHT KNEE - COMPLETE 4 VIEW COMPARISON:  None Available. FINDINGS: No evidence of fracture, dislocation, or joint effusion. No evidence of arthropathy or other focal bone abnormality. Soft tissues are unremarkable. IMPRESSION: No acute osseous abnormality. Electronically Signed   By: Yetta Glassman M.D.   On: 08/27/2022 16:13   DG Hand Complete Right  Result Date: 08/27/2022 CLINICAL DATA:  Right hand pain after fall. EXAM: RIGHT HAND - COMPLETE 3+ VIEW COMPARISON:  None Available. FINDINGS: Minimally displaced fracture is seen involving the first distal phalanx. No other fracture or dislocation is noted. Joint  spaces are unremarkable. IMPRESSION: Minimally displaced first distal phalangeal fracture. Electronically Signed   By: Marijo Conception M.D.   On: 08/27/2022 15:12   DG Hip Unilat  With Pelvis 2-3 Views Right  Result Date: 08/27/2022 CLINICAL DATA:  Right hip pain after fall. EXAM: DG HIP (WITH OR WITHOUT PELVIS) 2-3V RIGHT COMPARISON:  December 22, 2020. FINDINGS: Mildly displaced fracture is seen involving the proximal right femoral neck. Status post left hip arthroplasty. IMPRESSION: Mildly displaced proximal right femoral neck fracture. Electronically Signed   By: Marijo Conception M.D.   On: 08/27/2022 15:11   (Echo, Carotid, EGD, Colonoscopy, ERCP)    Subjective: Patient seen and examined in the morning rounds.  Patient's husband at the bedside.  Patient is active, she is aware about post hip replacement procedures and eager to go home today.   Discharge Exam: Vitals:   08/29/22 0559 08/29/22 0937  BP: 139/66 116/62  Pulse: 70 78  Resp: 17 16  Temp: 98.1 F (36.7 C) 98.7 F (37.1 C)  SpO2: 99% 97%    Vitals:   08/28/22 2227 08/29/22 0115 08/29/22 0559 08/29/22 0937  BP: (!) 107/58 103/63 139/66 116/62  Pulse: 91 74 70 78  Resp: '17 17 17 16  '$ Temp: 98.7 F (37.1 C) 98 F (36.7 C) 98.1 F (36.7 C) 98.7 F (37.1 C)  TempSrc: Oral  Oral Oral  SpO2: 96% 98% 99% 97%  Weight:      Height:        General: Pt is alert, awake, not in acute distress Cardiovascular: RRR, S1/S2 +, no rubs, no gallops Respiratory: CTA bilaterally, no wheezing, no rhonchi Abdominal: Soft, NT, ND, bowel sounds + Extremities: no edema, no cyanosis Right hip anterior incision clean and dry.  No edema.  Distal neurovascular status intact.    The results of significant diagnostics from this hospitalization (including imaging, microbiology, ancillary and laboratory) are listed below for reference.     Microbiology: Recent Results (from the past 240 hour(s))  Surgical PCR screen     Status: None   Collection Time: 08/28/22  4:29 AM   Specimen: Nasal Mucosa; Nasal Swab  Result Value Ref Range Status   MRSA, PCR NEGATIVE NEGATIVE Final   Staphylococcus aureus NEGATIVE NEGATIVE Final    Comment: (NOTE) The Xpert SA Assay (FDA approved for NASAL specimens in patients 45 years of age and older), is one component of a comprehensive surveillance program. It is not intended to diagnose infection nor to guide or monitor treatment. Performed at [facility], [place] [address], [place], [place] [address]      Labs: BNP (last 3 results) No results for input(s): "BNP" in the last 8760 hours. Basic Metabolic Panel: Recent Labs  Lab 08/27/22 1612 08/27/22 2211 08/28/22 0356  NA 125* 130* 133*  K 3.5 3.9 4.5  CL 88* 98 97*  CO2 '25 25 29  '$ GLUCOSE 121* 147* 143*  BUN '12 13 12  '$ CREATININE 0.86 0.89 0.80  CALCIUM 9.5 9.2 9.0   Liver Function Tests: No results for input(s): "AST", "ALT", "ALKPHOS", "BILITOT", "PROT", "ALBUMIN" in the last 168 hours. No results for input(s): "LIPASE",  "AMYLASE" in the last 168 hours. No results for input(s): "AMMONIA" in the last 168 hours. CBC: Recent Labs  Lab 08/27/22 1612 08/28/22 0356  WBC 14.3* 10.4  NEUTROABS 11.9*  --   HGB 13.6 12.8  HCT 38.2 37.3  MCV 89.7 94.0  PLT 304 263   Cardiac Enzymes:  No results for input(s): "CKTOTAL", "CKMB", "CKMBINDEX", "TROPONINI" in the last 168 hours. BNP: Invalid input(s): "POCBNP" CBG: No results for input(s): "GLUCAP" in the last 168 hours. D-Dimer No results for input(s): "DDIMER" in the last 72 hours. Hgb A1c No results for input(s): "HGBA1C" in the last 72 hours. Lipid Profile No results for input(s): "CHOL", "HDL", "LDLCALC", "TRIG", "CHOLHDL", "LDLDIRECT" in the last 72 hours. Thyroid function studies No results for input(s): "TSH", "T4TOTAL", "T3FREE", "THYROIDAB" in the last 72 hours.  Invalid input(s): "FREET3" Anemia work up No results for input(s): "VITAMINB12", "FOLATE", "FERRITIN", "TIBC", "IRON", "RETICCTPCT" in the last 72 hours. Urinalysis No results found for: "COLORURINE", "APPEARANCEUR", "LABSPEC", "PHURINE", "GLUCOSEU", "HGBUR", "BILIRUBINUR", "KETONESUR", "PROTEINUR", "UROBILINOGEN", "NITRITE", "LEUKOCYTESUR" Sepsis Labs Recent Labs  Lab 08/27/22 1612 08/28/22 0356  WBC 14.3* 10.4   Microbiology Recent Results (from the past 240 hour(s))  Surgical PCR screen     Status: None   Collection Time: 08/28/22  4:29 AM   Specimen: Nasal Mucosa; Nasal Swab  Result Value Ref Range Status   MRSA, PCR NEGATIVE NEGATIVE Final   Staphylococcus aureus NEGATIVE NEGATIVE Final    Comment: (NOTE) The Xpert SA Assay (FDA approved for NASAL specimens in patients 5 years of age and older), is one component of a comprehensive surveillance program. It is not intended to diagnose infection nor to guide or monitor treatment. Performed at [facility], [place] [address], [place], [place] [address]      Time coordinating discharge: 35  minutes  SIGNED:   Barb Merino, MD  [facility] 08/29/2022, 1:02 PM

## 2022-08-29 NOTE — Progress Notes (Signed)
Subjective: 1 Day Post-Op Procedure(s) (LRB): RIGHT TOTAL HIP ARTHROPLASTY ANTERIOR APPROACH (Right)  Activity level:  bedrest Diet tolerance:  regular Voiding:  foley just removed Patient reports pain as mild.    Objective: Vital signs in last 24 hours: Temp:  [98 F (36.7 C)-98.7 F (37.1 C)] 98.1 F (36.7 C) (09/27 0559) Pulse Rate:  [70-98] 70 (09/27 0559) Resp:  [12-21] 17 (09/27 0559) BP: (101-140)/(58-66) 139/66 (09/27 0559) SpO2:  [93 %-99 %] 99 % (09/27 0559) Weight:  [70.9 kg] 70.9 kg (09/26 1323)  Labs: Recent Labs    08/27/22 1612 08/28/22 0356  HGB 13.6 12.8   Recent Labs    08/27/22 1612 08/28/22 0356  WBC 14.3* 10.4  RBC 4.26 3.97  HCT 38.2 37.3  PLT 304 263   Recent Labs    08/27/22 2211 08/28/22 0356  NA 130* 133*  K 3.9 4.5  CL 98 97*  CO2 25 29  BUN 13 12  CREATININE 0.89 0.80  GLUCOSE 147* 143*  CALCIUM 9.2 9.0   No results for input(s): "LABPT", "INR" in the last 72 hours.  Physical Exam:  Neurologically intact ABD soft Sensation intact distally Intact pulses distally Compartment soft  Assessment/Plan:  1 Day Post-Op Procedure(s) (LRB): RIGHT TOTAL HIP ARTHROPLASTY ANTERIOR APPROACH (Right) Up with therapy D/C IV fluids Discharge home with home health  Home today if clears PT.  ASA bid for DVT prophylaxis. Meds called in (ASA and Tramadol) Follow up with me in 10-12 days  Hessie Dibble 08/29/2022, 7:37 AM

## 2022-08-30 ENCOUNTER — Telehealth: Payer: Self-pay

## 2022-08-30 NOTE — Telephone Encounter (Signed)
Transition Care Management Follow-up Telephone Call Date of discharge and from where: 08/29/2022 [facility] How have you been since you were released from the hospital? Doing ok Any questions or concerns? No  Items Reviewed: Did the pt receive and understand the discharge instructions provided? Yes  Medications obtained and verified? Yes  Other? No  Any new allergies since your discharge? No  Dietary orders reviewed? Yes Do you have support at home? Yes   Home Care and Equipment/Supplies: Were home health services ordered? States she is going to work with the people she used last year If so, what is the name of the agency?   Has the agency set up a time to come to the patient's home? no Were any new equipment or medical supplies ordered?  No What is the name of the medical supply agency? N/a Were you able to get the supplies/equipment? no Do you have any questions related to the use of the equipment or supplies? No  Functional Questionnaire: (I = Independent and D = Dependent) ADLs: I  Bathing/Dressing- I  Meal Prep- I  Eating- I  Maintaining continence- I  Transferring/Ambulation- I  Managing Meds- I  Follow up appointments reviewed:  PCP Hospital f/u appt confirmed? No  States she does not need to follow up with Dr. Volanda Napoleon now. She will follow up with the orthopedist. Are transportation arrangements needed? No  If their condition worsens, is the pt aware to call PCP or go to the Emergency Dept.? Yes Was the patient provided with contact information for the PCP's office or ED? Yes Was to pt encouraged to call back with questions or concerns? Yes

## 2022-09-04 ENCOUNTER — Encounter: Payer: Self-pay | Admitting: Family Medicine

## 2022-09-06 ENCOUNTER — Telehealth (INDEPENDENT_AMBULATORY_CARE_PROVIDER_SITE_OTHER): Payer: Medicare Other | Admitting: Adult Health

## 2022-09-06 ENCOUNTER — Encounter: Payer: Self-pay | Admitting: Adult Health

## 2022-09-06 VITALS — Ht 68.0 in | Wt 156.3 lb

## 2022-09-06 DIAGNOSIS — R051 Acute cough: Secondary | ICD-10-CM | POA: Diagnosis not present

## 2022-09-06 MED ORDER — FLUTICASONE PROPIONATE 50 MCG/ACT NA SUSP: 2.0000 | Freq: Every day | NASAL | 6 refills | Status: DC

## 2022-09-06 MED ORDER — BENZONATATE 200 MG PO CAPS: 200.0000 mg | ORAL_CAPSULE | Freq: Two times a day (BID) | ORAL | 1 refills | Status: DC | PRN

## 2022-09-06 NOTE — Progress Notes (Signed)
Virtual Visit via Video Note  I connected with Sherry Harrison on 09/06/22 at  2:15 PM EDT by a video enabled telemedicine application and verified that I am speaking with the correct person using two identifiers.  Location patient: home Location provider:work or home office Persons participating in the virtual visit: patient, provider  I discussed the limitations of evaluation and management by telemedicine and the availability of in person appointments. The patient expressed understanding and agreed to proceed.   HPI: D86-year-old female who is being evaluated today for cough x1 week.  Was discharged from the hospital days ago after surgery for a closed fracture of the neck of right femur, prior to that she did cold-like symptoms that were very mild.  When she was in the hospital she developed a cough that has continued.  Cough is mostly dry in the morning and then becomes more productive throughout the rest of the day.  She also has rhinorrhea with clear nasal discharge.  She denies fevers, chills, chest congestion, chest tightness, nausea, vomiting, diarrhea, sinus pain or pressure, or ear pain.  At home she has been using Mucinex, Robitussin, and NyQuil.  She does feel as though her cough is improving slightly   ROS: See pertinent positives and negatives per HPI.  Past Medical History:  Diagnosis Date   Cancer ([place])    Hypertension     Past Surgical History:  Procedure Laterality Date   HIP SURGERY     HIP SURGERY Left    TONSILLECTOMY     TOTAL HIP ARTHROPLASTY Right 08/28/2022   Procedure: RIGHT TOTAL HIP ARTHROPLASTY ANTERIOR APPROACH;  Surgeon: [name], MD;  Location: [facility];  Service: Orthopedics;  Laterality: Right;    Family History  Problem Relation Age of Onset   Breast cancer Mother        pagets disease of nipple       Current Outpatient Medications:    acetaminophen (TYLENOL) 500 MG tablet, Take 1,000 mg by mouth every 6 (six) hours as needed for mild  pain or moderate pain., Disp: , Rfl:    aspirin 81 MG chewable tablet, Chew 1 tablet (81 mg total) by mouth 2 (two) times daily., Disp: , Rfl:    Calcium Carb-Cholecalciferol (CALCIUM 600-D PO), Take 600 mg by mouth daily., Disp: , Rfl:    lisinopril (ZESTRIL) 10 MG tablet, Take 1 tablet (10 mg total) by mouth daily., Disp: 30 tablet, Rfl: 11   traMADol (ULTRAM) 50 MG tablet, Take 1 tablet (50 mg total) by mouth every 6 (six) hours as needed for moderate pain or severe pain (post op pain)., Disp: 30 tablet, Rfl:   EXAM:  VITALS per patient if applicable:  GENERAL: alert, oriented, appears well and in no acute distress  HEENT: atraumatic, conjunttiva clear, no obvious abnormalities on inspection of external nose and ears  NECK: normal movements of the head and neck  LUNGS: on inspection no signs of respiratory distress, breathing rate appears normal, no obvious gross SOB, gasping or wheezing  CV: no obvious cyanosis  MS: moves all visible extremities without noticeable abnormality  PSYCH/NEURO: pleasant and cooperative, no obvious depression or anxiety, speech and thought processing grossly intact  ASSESSMENT AND PLAN:  Discussed the following assessment and plan:  1. Acute cough -Seems to be likely from postnasal drip.  Not concern for pneumonia, bronchitis or other lung disease.  We will have her use Flonase and Tessalon Perles.  Red flags follow-up instructions reviewed - fluticasone (FLONASE) 50 MCG/ACT  nasal spray; Place 2 sprays into both nostrils daily.  Dispense: 16 g; Refill: 6 - benzonatate (TESSALON) 200 MG capsule; Take 1 capsule (200 mg total) by mouth 2 (two) times daily as needed for cough.  Dispense: 20 capsule; Refill: 1      I discussed the assessment and treatment plan with the patient. The patient was provided an opportunity to ask questions and all were answered. The patient agreed with the plan and demonstrated an understanding of the instructions.   The  patient was advised to call back or seek an in-person evaluation if the symptoms worsen or if the condition fails to improve as anticipated.   Sherry Peng, NP

## 2022-09-10 ENCOUNTER — Encounter: Payer: Self-pay | Admitting: Adult Health

## 2022-09-17 ENCOUNTER — Telehealth: Payer: Self-pay | Admitting: Family Medicine

## 2022-09-17 NOTE — Telephone Encounter (Signed)
Pt c/o stomach pains, was sent to triage nurse who suggested she go to urgent care. Patient does not want to go to urgent care. Pls advise patient on next steps

## 2022-09-17 NOTE — Telephone Encounter (Signed)
-  caller states is having stomach pain for the past week. states is woken from sleep with gas pains. states is eating normally. last BM yesterday with normal looking stool. states feels like indigestion and that it's located in the middle and above the navel.  09/17/2022 11:33:05 AM See PCP within 24 Hours Joya Gaskins, RN, [facility]  Comments User: Salem Caster, RN Date/Time Eilene Ghazi Time): 09/17/2022 11:31:07 AM Called backline and spoke to [place] and was informed that there are no available appointments at the present time.  Referrals GO TO FACILITY UNDECIDED  Pt has appt with PCP on 09/18/22

## 2022-09-17 NOTE — Telephone Encounter (Signed)
Pt has an appt sch with dr fry for 09-18-2022

## 2022-09-18 ENCOUNTER — Encounter: Payer: Self-pay | Admitting: Family Medicine

## 2022-09-18 ENCOUNTER — Ambulatory Visit (INDEPENDENT_AMBULATORY_CARE_PROVIDER_SITE_OTHER): Payer: Medicare Other | Admitting: Family Medicine

## 2022-09-18 VITALS — BP 108/64 | HR 90 | Temp 99.0°F | Wt 152.4 lb

## 2022-09-18 DIAGNOSIS — R1010 Upper abdominal pain, unspecified: Secondary | ICD-10-CM | POA: Diagnosis not present

## 2022-09-18 NOTE — Progress Notes (Signed)
   Subjective:    Patient ID: Sherry Harrison, female    DOB: [birth year], 66 y.o.   MRN: [id number]  HPI Here with her husband for 9 days of intermittent upper abdominal pain. She recently had hip surgery, and she has been taking aspirin daily to prevent blood clots. She has had some intermittent diarrhea, constipation, and normal stools that go back and forth. The pain feels better after she eats a meal, then it gets worse when her stomach is empty.  No nausea or fever. No heartburn. She has felt much better yesterday and today.    Review of Systems  Constitutional: Negative.   Respiratory: Negative.    Cardiovascular: Negative.   Gastrointestinal:  Positive for abdominal pain, constipation and diarrhea. Negative for abdominal distention, blood in stool, nausea and vomiting.  Genitourinary: Negative.        Objective:   Physical Exam Constitutional:      Appearance: She is not ill-appearing.     Comments: Walks with a cane   Cardiovascular:     Rate and Rhythm: Normal rate and regular rhythm.     Pulses: Normal pulses.     Heart sounds: Normal heart sounds.  Pulmonary:     Effort: Pulmonary effort is normal.     Breath sounds: Normal breath sounds.  Abdominal:     General: Abdomen is flat. Bowel sounds are normal. There is no distension.     Palpations: Abdomen is soft. There is no mass.     Tenderness: There is no abdominal tenderness. There is no right CVA tenderness, left CVA tenderness, guarding or rebound.     Hernia: No hernia is present.     Comments: No tenderness at all   Neurological:     Mental Status: She is alert.           Assessment & Plan:  The most likely scenario is she has had some gastritis or duodenitis as a result of the stress of surgery and taking aspirin. She will stop the aspirin. She will try Omeprazole 20 mg BID for a week or so. Recheck as needed.  Alysia Penna, MD

## 2022-09-19 ENCOUNTER — Ambulatory Visit: Payer: Medicare Other | Admitting: Family Medicine

## 2022-10-07 ENCOUNTER — Encounter: Payer: Self-pay | Admitting: Family Medicine

## 2022-10-12 ENCOUNTER — Ambulatory Visit (INDEPENDENT_AMBULATORY_CARE_PROVIDER_SITE_OTHER): Payer: Medicare Other | Admitting: Family Medicine

## 2022-10-12 VITALS — BP 128/70 | HR 78 | Temp 98.2°F | Wt 153.4 lb

## 2022-10-12 DIAGNOSIS — Z23 Encounter for immunization: Secondary | ICD-10-CM | POA: Diagnosis not present

## 2022-10-12 DIAGNOSIS — L989 Disorder of the skin and subcutaneous tissue, unspecified: Secondary | ICD-10-CM

## 2022-10-12 DIAGNOSIS — S62521D Displaced fracture of distal phalanx of right thumb, subsequent encounter for fracture with routine healing: Secondary | ICD-10-CM

## 2022-10-12 DIAGNOSIS — I10 Essential (primary) hypertension: Secondary | ICD-10-CM | POA: Diagnosis not present

## 2022-10-12 DIAGNOSIS — S72001A Fracture of unspecified part of neck of right femur, initial encounter for closed fracture: Secondary | ICD-10-CM

## 2022-10-12 NOTE — Progress Notes (Signed)
Subjective:    Patient ID: Sherry Harrison, female    DOB: Jan 11, 1956, 66 y.o.   MRN: [id number]  Chief Complaint  Patient presents with  . Follow-up    Possible fluid on finer.  Fell 6 wkks ago, broke hip.  Flu shot    HPI Patient was seen today for follow-up.  Patient fell 6 weeks ago fracturing right femoral neck while playing tennis.  Patient landed on right hand, fracturing right first digit and having an abrasion on right fifth digit.  Patient states abrasion scabbed up, but now has the appearance of a red blister, tender to touch.  Right thumb very edematous 2/2 distal phalanx fracture.   Past Medical History:  Diagnosis Date  . Cancer ([place])   . Hypertension     Allergies  Allergen Reactions  . Codeine Nausea Only  . Xanax [Alprazolam] Other (See Comments)    Causes hyperness    ROS General: Denies fever, chills, night sweats, changes in weight, changes in appetite HEENT: Denies headaches, ear pain, changes in vision, rhinorrhea, sore throat CV: Denies CP, palpitations, SOB, orthopnea Pulm: Denies SOB, cough, wheezing GI: Denies abdominal pain, nausea, vomiting, diarrhea, constipation GU: Denies dysuria, hematuria, frequency, vaginal discharge Msk: FROM right fifth digit.  Edema right first digit denies muscle cramps, joint pains Neuro: Denies weakness, numbness, tingling Skin: Denies rashes, bruising Psych: Denies depression, anxiety, hallucinations      Objective:    Blood pressure 128/70, pulse 78, temperature 98.2 F (36.8 C), temperature source Oral, weight 153 lb 6.4 oz (69.6 kg), SpO2 99 %.   Gen. Pleasant, well-nourished, in no distress, normal affect   HEENT: [place]/AT, face symmetric, conjunctiva clear, no scleral icterus, PERRLA, EOMI, nares patent without drainage, pharynx without erythema or exudate. Neck: No JVD, no thyromegaly, no carotid bruits Lungs: no accessory muscle use, CTAB, no wheezes or rales Cardiovascular: RRR, no m/r/g, no  ***peripheral edema Abdomen: BS present, soft, NT/ND, no hepatosplenomegaly. Musculoskeletal: No deformities, no cyanosis or clubbing, normal tone Neuro:  A&Ox3, CN II-XII intact, normal gait Skin:  Warm, no lesions/ rash   Wt Readings from Last 3 Encounters:  10/12/22 153 lb 6.4 oz (69.6 kg)  09/18/22 152 lb 6 oz (69.1 kg)  09/06/22 156 lb 4.8 oz (70.9 kg)    Lab Results  Component Value Date   WBC 10.4 08/28/2022   HGB 12.8 08/28/2022   HCT 37.3 08/28/2022   PLT 263 08/28/2022   GLUCOSE 143 (H) 08/28/2022   CHOL 266 (H) 04/24/2021   TRIG 134.0 04/24/2021   HDL 75.70 04/24/2021   LDLCALC 163 (H) 04/24/2021   NA 133 (L) 08/28/2022   K 4.5 08/28/2022   CL 97 (L) 08/28/2022   CREATININE 0.80 08/28/2022   BUN 12 08/28/2022   CO2 29 08/28/2022   TSH 3.24 04/24/2021   INR 1.0 09/14/2021    Assessment/Plan:  Non-healing skin lesion -Discussed supportive care including Epsom salt soaks -Blisterlike lesion on right fifth digit may have dirt/debris remaining inside after fall on tennis court causing delayed healing.  Infection also to be considered though not as tender as one would expect. -Discussed referral to hand surgery as may benefit from drainage. - Plan: Ambulatory referral to Hand Surgery  Closed displaced fracture of distal phalanx of right thumb with routine healing, subsequent encounter  -Healing -Continue follow-up with Ortho - Plan: Ambulatory referral to Hand Surgery  Closed displaced fracture of right femoral neck (HCC) -Stable -Continue follow-up with Ortho  Essential hypertension -  Controlled -Continue current medications  Need for influenza vaccination  - Plan: Flu Vaccine QUAD 6+ mos PF IM (Fluarix Quad PF)  F/u prn  Grier Mitts, MD

## 2022-10-18 ENCOUNTER — Encounter: Payer: Self-pay | Admitting: Family Medicine

## 2023-02-11 ENCOUNTER — Other Ambulatory Visit: Payer: Self-pay | Admitting: Family Medicine

## 2023-02-11 DIAGNOSIS — Z1231 Encounter for screening mammogram for malignant neoplasm of breast: Secondary | ICD-10-CM

## 2023-03-27 ENCOUNTER — Ambulatory Visit
Admission: RE | Admit: 2023-03-27 | Discharge: 2023-03-27 | Disposition: A | Discharge status: Home / Self Care | Payer: Medicare Other | Source: Ambulatory Visit

## 2023-03-27 DIAGNOSIS — Z1231 Encounter for screening mammogram for malignant neoplasm of breast: Secondary | ICD-10-CM

## 2023-05-23 ENCOUNTER — Ambulatory Visit (INDEPENDENT_AMBULATORY_CARE_PROVIDER_SITE_OTHER): Payer: Medicare Other | Admitting: Family Medicine

## 2023-05-23 DIAGNOSIS — Z0000 Encounter for general adult medical examination without abnormal findings: Secondary | ICD-10-CM | POA: Diagnosis not present

## 2023-05-23 NOTE — Patient Instructions (Signed)
I really enjoyed getting to talk with you today! I am available on Tuesdays and Thursdays for virtual visits if you have any questions or concerns, or if I can be of any further assistance.   CHECKLIST FROM ANNUAL WELLNESS VISIT:  -Follow up (please call to schedule if not scheduled after visit):   -yearly for annual wellness visit with primary care office  -yearly in office visit for labs - request hgba1c to check blood sugar  Here is a list of your preventive care/health maintenance measures and the plan for each if any are due:  PLAN For any measures below that may be due:  -can get covid boosters at the pharmacy -insurance will usually pay for a bone density test every two years - if you wish to do this let us know  Health Maintenance  Topic Date Due   COVID-19 Vaccine (6 - 2023-24 season) 08/03/2022   INFLUENZA VACCINE  07/04/2023   Medicare Annual Wellness (AWV)  05/22/2024   DTaP/Tdap/Td (2 - Td or Tdap) 11/02/2024   MAMMOGRAM  03/26/2025   Colonoscopy  03/23/2026   Pneumonia Vaccine 67+ Years old  Completed   DEXA SCAN  Completed   Hepatitis C Screening  Completed   Zoster Vaccines- Shingrix  Completed   HPV VACCINES  Aged Out    -See a dentist at least yearly  -Get your eyes checked and then per your eye specialist's recommendations  -Other issues addressed today:   -we recommend limiting alcohol to no more than one drink per 24 hour period  -have included some information about advanced directive below   -I have included below further information regarding a healthy whole foods based diet, physical activity guidelines for adults, stress management and opportunities for social connections. I hope you find this information useful.    -----------------------------------------------------------------------------------------------------------------------------------------------------------------------------------------------------------------------------------------------------------  NUTRITION: -eat real food: lots of colorful vegetables (half the plate) and fruits -5-7 servings of vegetables and fruits per day (fresh or steamed is best), exp. 2 servings of vegetables with lunch and dinner and 2 servings of fruit per day. Berries and greens such as kale and collards are great choices.  -consume on a regular basis: whole grains (make sure first ingredient on label contains the word "whole"), fresh fruits, fish, nuts, seeds, healthy oils (such as olive oil, avocado oil, grape seed oil) -may eat small amounts of dairy and lean meat on occasion, but avoid processed meats such as ham, bacon, lunch meat, etc. -drink water -try to avoid fast food and pre-packaged foods, processed meat -most experts advise limiting sodium to < 2300mg  per day, should limit further is any chronic conditions such as high blood pressure, heart disease, diabetes, etc. The American Heart Association advised that < 1500mg  is is ideal -try to avoid foods that contain any ingredients with names you do not recognize  -try to avoid sugar/sweets (except for the natural sugar that occurs in fresh fruit) -try to avoid sweet drinks -try to avoid white rice, white bread, pasta (unless whole grain), white or yellow potatoes  EXERCISE GUIDELINES FOR ADULTS: -if you wish to increase your physical activity, do so gradually and with the approval of your doctor -STOP and seek medical care immediately if you have any chest pain, chest discomfort or trouble breathing when starting or increasing exercise  -move and stretch your body, legs, feet and arms when sitting for long periods -Physical activity guidelines for optimal health in adults: -least 150 minutes per week of  aerobic exercise (can talk,  but not sing) once approved by your doctor, 20-30 minutes of sustained activity or two 10 minute episodes of sustained activity every day.  -resistance training at least 2 days per week if approved by your doctor -balance exercises 3+ days per week:   Stand somewhere where you have something sturdy to hold onto if you lose balance.    1) lift up on toes, start with 5x per day and work up to 20x   2) stand and lift on leg straight out to the side so that foot is a few inches of the floor, start with 5x each side and work up to 20x each side   3) stand on one foot, start with 5 seconds each side and work up to 20 seconds on each side  If you need ideas or help with getting more active:  -Silver sneakers https://tools.silversneakers.com  -Walk with a Doc: http://www.duncan-williams.com/  -try to include resistance (weight lifting/strength building) and balance exercises twice per week: or the following link for ideas: http://castillo-powell.com/  BuyDucts.dk  STRESS MANAGEMENT: -can try meditating, or just sitting quietly with deep breathing while intentionally relaxing all parts of your body for 5 minutes daily -if you need further help with stress, anxiety or depression please follow up with your primary doctor or contact the wonderful folks at [facility]: [phone]  SOCIAL CONNECTIONS: -options in [place] if you wish to engage in more social and exercise related activities:  -Silver sneakers https://tools.silversneakers.com  -Walk with a Doc: http://www.duncan-williams.com/  -Check out the [facility] Active Adults 50+ section on the [place] [employer] (hiking clubs, book clubs, cards and games, chess, exercise classes, aquatic classes and much more) - see the website for  details: https://www.[place].gov/departments/parks-recreation/active-adults50  -YouTube has lots of exercise videos for different ages and abilities as well  -[facility] (a variety of indoor and outdoor inperson activities for adults). [phone]. [address]  -Virtual Online Classes (a variety of topics): see seniorplanet.org or call [phone]  -consider volunteering at a school, hospice center, church, senior center or elsewhere   ADVANCED HEALTHCARE DIRECTIVES:  Everyone should have advanced health care directives in place. This is so that you get the care you want, should you ever be in a situation where you are unable to make your own medical decisions.   From the [place] Advanced Directive Website: "Advance Health Care Directives are legal documents in which you give written instructions about your health care if, in the future, you cannot speak for yourself.   A health care power of attorney allows you to name a person you trust to make your health care decisions if you cannot make them yourself. A declaration of a desire for a natural death (or living will) is document, which states that you desire not to have your life prolonged by extraordinary measures if you have a terminal or incurable illness or if you are in a vegetative state. An advance instruction for mental health treatment makes a declaration of instructions, information and preferences regarding your mental health treatment. It also states that you are aware that the advance instruction authorizes a mental health treatment provider to act according to your wishes. It may also outline your consent or refusal of mental health treatment. A declaration of an anatomical gift allows anyone over the age of 55 to make a gift by will, organ donor card or other document."   Please see the following website or an elder law attorney for forms, FAQs and for  completion of advanced directives: Psychiatric nurse Health Care Directives Advance Health Care Directives (http://guzman.com/)  Or copy and paste the following to your web browser: PoshChat.fi

## 2023-05-23 NOTE — Progress Notes (Signed)
PATIENT CHECK-IN and HEALTH RISK ASSESSMENT QUESTIONNAIRE:  -completed by phone/video for upcoming Medicare Preventive Visit  Pre-Visit Check-in: 1)Vitals (height, wt, BP, etc) - record in vitals section for visit on day of visit 2)Review and Update Medications, Allergies PMH, Surgeries, Social history in Epic 3)Hospitalizations in the last year with date/reason? Once for hip surgery from mechanical fall 4)Review and Update Care Team (patient's specialists) in Epic 5) Complete PHQ9 in Epic  6) Complete Fall Screening in Epic 7)Review all Health Maintenance Due and order under PCP if not done.  8)Medicare Wellness Questionnaire: Answer theses question about your habits: Do you drink alcohol?  Yes, on the weekends a few drinks per day Have you ever smoked? no How many packs a day do/did you smoke? na Do you use smokeless tobacco?na Do you use an illicit drugs?na Do you exercises?  Exercises over an hour 5 days per week, walking, gym, yoga, swimming Are you sexually active? Yes, no concerns for sti Feels diet is good Typical breakfast: oatmeal, raisins, almonds, honey, poached egg, bacon, hashbrown Typical lunch: salad, goat cheese, figs, avocado, ham and cheese, fruit with yogurt and granola Typical dinner: grilling, salmon or chicken, pasta, rice, potatoes, lots of veggies Typical snacks: nuts, fruit, cheese  Beverages: water, coffee  Answer theses question about you: Can you perform most household chores? yes Do you find it hard to follow a conversation in a noisy room? no Do you often ask people to speak up or repeat themselves?no Do you feel that you have a problem with memory?no Do you balance your checkbook and or bank acounts?yes Do you feel safe at home?yes Last dentist visit? Last month Do you need assistance with any of the following: Please note if so  - none  Driving?  Feeding yourself?  Getting from bed to chair?  Getting to the toilet?  Bathing or  showering?  Dressing yourself?  Managing money?  Climbing a flight of stairs  Preparing meals?  Do you have Advanced Directives in place (Living Will, Healthcare Power or Attorney)? no   Last eye Exam and location? Had exam about 4 weeks ago with [facility]   Do you currently use prescribed or non-prescribed narcotic or opioid pain medications?   Do you have a history or close family history of breast, ovarian, tubal or peritoneal cancer or a family member with BRCA (breast cancer susceptibility 1 and 2) gene mutations?  Nurse/Assistant Credentials/time stamp:   ----------------------------------------------------------------------------------------------------------------------------------------------------------------------------------------------------------------------   MEDICARE ANNUAL PREVENTIVE VISIT WITH PROVIDER: (Welcome to [employer], initial annual wellness or annual wellness exam)  Virtual Visit via Video Note  I connected with Sherry Harrison on 05/23/23 by a video enabled telemedicine application and verified that I am speaking with the correct person using two identifiers.  Location patient: home Location provider:work or home office Persons participating in the virtual visit: patient, provider  Concerns and/or follow up today: reports everything is good.   See HM section in Epic for other details of completed HM.    ROS: negative for report of fevers, unintentional weight loss, vision changes, vision loss, hearing loss or change, chest pain, sob, hemoptysis, melena, hematochezia, hematuria, falls, bleeding or bruising, thoughts of suicide or self harm, memory loss  Patient-completed extensive health risk assessment - reviewed and discussed with the patient: See Health Risk Assessment completed with patient prior to the visit either above or in recent phone note. This was reviewed in detailed with the patient today and appropriate recommendations, orders and  referrals  were placed as needed per Summary below and patient instructions.   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   Procedure: RIGHT TOTAL HIP ARTHROPLASTY ANTERIOR APPROACH;  Surgeon: Marcene Corning, MD;  Location: [facility];  Service: Orthopedics;  Laterality: Right;    Social History   Socioeconomic History   Marital status: Married    Spouse name: Not on file   Number of children: Not on file   Years of education: Not on file   Highest education level: Not on file  Occupational History   Not on file  Tobacco Use   Smoking status: Former    Types: Cigarettes    Start date: 52    Quit date: 98    Years since quitting: 44.4   Smokeless tobacco: Never  Substance and Sexual Activity   Alcohol use: Yes   Drug use: Never   Sexual activity: Not on file  Other Topics Concern   Not on file  Social History Narrative   Not on file   Social Determinants of Health   Financial Resource Strain: Low Risk  (05/24/2022)   Overall Financial Resource Strain (CARDIA)    Difficulty of Paying Living Expenses: Not hard at all  Food Insecurity: No Food Insecurity (08/27/2022)   Hunger Vital Sign    Worried About Running Out of Food in the Last Year: Never true    Ran Out of Food in the Last Year: Never true  Transportation Needs: No Transportation Needs (08/27/2022)   PRAPARE - Administrator, Civil Service (Medical): No    Lack of Transportation (Non-Medical): No  Physical Activity: Sufficiently Active (05/24/2022)   Exercise Vital Sign    Days of Exercise per Week: 4 days     Minutes of Exercise per Session: 130 min  Stress: No Stress Concern Present (05/24/2022)   [employer] of Occupational Health - Occupational Stress Questionnaire    Feeling of Stress : Not at all  Social Connections: Moderately Integrated (05/24/2022)   Social Connection and Isolation Panel [NHANES]    Frequency of Communication with Friends and Family: More than three times a week    Frequency of Social Gatherings with Friends and Family: More than three times a week    Attends Religious Services: Never    Database administrator or Organizations: Yes    Attends Engineer, structural: More than 4 times per year    Marital Status: Married  Catering manager Violence: Not At Risk (08/27/2022)   Humiliation, Afraid, Rape, and Kick questionnaire    Fear of Current or Ex-Partner: No    Emotionally Abused: No    Physically Abused: No    Sexually Abused: No    Family History  Problem Relation Age of Onset   Breast cancer Mother        pagets disease of nipple    Current Outpatient Medications on File Prior to Visit  Medication Sig Dispense Refill   aspirin 81 MG chewable tablet Chew 1 tablet (81 mg total) by mouth  2 (two) times daily. (Patient not taking: Reported on 10/12/2022)     fluticasone (FLONASE) 50 MCG/ACT nasal spray Place 2 sprays into both nostrils daily. (Patient not taking: Reported on 10/12/2022) 16 g 6   lisinopril (ZESTRIL) 10 MG tablet Take 1 tablet (10 mg total) by mouth daily. 30 tablet 11   No current facility-administered medications on file prior to visit.    Allergies  Allergen Reactions   Codeine Nausea Only   Xanax [Alprazolam] Other (See Comments)    Causes hyperness       Physical Exam There were no vitals filed for this visit. Estimated body mass index is 23.32 kg/m as calculated from the following:   Height as of 09/06/22: 5\' 8"  (1.727 m).   Weight as of 10/12/22: 153 lb 6.4 oz (69.6 kg).  EKG (optional): deferred due to virtual  visit  GENERAL: alert, oriented, no acute distress detected, full vision exam deferred due to pandemic and/or virtual encounter   HEENT: atraumatic, conjunttiva clear, no obvious abnormalities on inspection of external nose and ears  NECK: normal movements of the head and neck  LUNGS: on inspection no signs of respiratory distress, breathing rate appears normal, no obvious gross SOB, gasping or wheezing  CV: no obvious cyanosis  MS: moves all visible extremities without noticeable abnormality  PSYCH/NEURO: pleasant and cooperative, no obvious depression or anxiety, speech and thought processing grossly intact, Cognitive function grossly intact  Flowsheet Row Office Visit from 09/18/2022 in [facility] [place]  PHQ-9 Total Score 1           05/23/2023   12:59 PM 09/18/2022   10:49 AM 05/24/2022   10:02 AM 04/24/2021   10:31 AM  Depression screen PHQ 2/9  Decreased Interest 0 0 0 0  Down, Depressed, Hopeless 0 0 0 0  PHQ - 2 Score 0 0 0 0  Altered sleeping  0    Tired, decreased energy  1    Change in appetite  0    Feeling bad or failure about yourself   0    Trouble concentrating  0    Moving slowly or fidgety/restless  0    Suicidal thoughts  0    PHQ-9 Score  1    Difficult doing work/chores  Not difficult at all         08/28/2022    6:00 PM 08/28/2022    7:18 PM 08/29/2022    7:20 AM 09/18/2022   10:49 AM 05/23/2023   12:57 PM  Fall Risk  Falls in the past year?    1 1  Was there an injury with Fall?    1 1  Fall Risk Category Calculator    2 2  Fall Risk Category (Retired)    Moderate   (RETIRED) Patient Fall Risk Level High fall risk High fall risk High fall risk High fall risk   Patient at Risk for Falls Due to    Orthopedic patient History of fall(s)  Fall risk Follow up    Falls evaluation completed      SUMMARY AND PLAN:  Encounter for Medicare annual wellness exam   Discussed applicable health maintenance/preventive  health measures and advised and referred or ordered per patient preferences: -discussed all measures due, let her know she can get covid boosters at the pharmacy Health Maintenance  Topic Date Due   COVID-19 Vaccine (6 - 2023-24 season) 08/03/2022   INFLUENZA VACCINE  07/04/2023   Medicare Annual Wellness (  AWV)  05/22/2024   DTaP/Tdap/Td (2 - Td or Tdap) 11/02/2024   MAMMOGRAM  03/26/2025   Colonoscopy  03/23/2026   Pneumonia Vaccine 55+ Years old  Completed   DEXA SCAN  Completed   Hepatitis C Screening  Completed   Zoster Vaccines- Shingrix  Completed   HPV VACCINES  Aged [employer] and counseling on the following was provided based on the above review of health and a plan/checklist for the patient, along with additional information discussed, was provided for the patient in the patient instructions :  -Advised on importance of completing advanced directives, discussed options for completing and provided information in patient instructions as well -Advised and counseled on a healthy lifestyle - including the importance of a healthy diet, regular physical activity -Reviewed patient's current diet. Advised and counseled on a whole foods based healthy diet. A summary of a healthy diet was provided in the Patient Instructions. Reviewed her glu labs and may have mild prediabetes or insulin resistance - discussed food to avoid/consume to assist in stabilizing blood gl.  -reviewed patient's current physical activity level and discussed exercise guidelines for adults. Discussed community resources and ideas for safe exercise at home to assist in meeting exercise guideline recommendations in a safe and healthy way.  -Advise yearly dental visits at minimum and regular eye exams -Advised and counseled on alcohol safe limits, risks - advised to limit alcohol consumption to no more than 1 alcoholic beverage per 24 hour period  Follow up: see patient instructions     Patient Instructions  I  really enjoyed getting to talk with you today! I am available on Tuesdays and Thursdays for virtual visits if you have any questions or concerns, or if I can be of any further assistance.   CHECKLIST FROM ANNUAL WELLNESS VISIT:  -Follow up (please call to schedule if not scheduled after visit):   -yearly for annual wellness visit with primary care office  -yearly in office visit for labs - request hgba1c to check blood sugar  Here is a list of your preventive care/health maintenance measures and the plan for each if any are due:  PLAN For any measures below that may be due:  -can get covid boosters at the pharmacy -insurance will usually pay for a bone density test every two years - if you wish to do this let us know  Health Maintenance  Topic Date Due   COVID-19 Vaccine (6 - 2023-24 season) 08/03/2022   INFLUENZA VACCINE  07/04/2023   Medicare Annual Wellness (AWV)  05/22/2024   DTaP/Tdap/Td (2 - Td or Tdap) 11/02/2024   MAMMOGRAM  03/26/2025   Colonoscopy  03/23/2026   Pneumonia Vaccine 46+ Years old  Completed   DEXA SCAN  Completed   Hepatitis C Screening  Completed   Zoster Vaccines- Shingrix  Completed   HPV VACCINES  Aged Out    -See a dentist at least yearly  -Get your eyes checked and then per your eye specialist's recommendations  -Other issues addressed today:   -we recommend limiting alcohol to no more than one drink per 24 hour period  -have included some information about advanced directive below   -I have included below further information regarding a healthy whole foods based diet, physical activity guidelines for adults, stress management and opportunities for social connections. I hope you find this information useful.    -----------------------------------------------------------------------------------------------------------------------------------------------------------------------------------------------------------------------------------------------------------  NUTRITION: -eat real food: lots of colorful vegetables (half the plate) and fruits -5-7 servings of  vegetables and fruits per day (fresh or steamed is best), exp. 2 servings of vegetables with lunch and dinner and 2 servings of fruit per day. Berries and greens such as kale and collards are great choices.  -consume on a regular basis: whole grains (make sure first ingredient on label contains the word "whole"), fresh fruits, fish, nuts, seeds, healthy oils (such as olive oil, avocado oil, grape seed oil) -may eat small amounts of dairy and lean meat on occasion, but avoid processed meats such as ham, bacon, lunch meat, etc. -drink water -try to avoid fast food and pre-packaged foods, processed meat -most experts advise limiting sodium to < '2300mg'$  per day, should limit further is any chronic conditions such as high blood pressure, heart disease, diabetes, etc. The American Heart Association advised that < '1500mg'$  is is ideal -try to avoid foods that contain any ingredients with names you do not recognize  -try to avoid sugar/sweets (except for the natural sugar that occurs in fresh fruit) -try to avoid sweet drinks -try to avoid white rice, white bread, pasta (unless whole grain), white or yellow potatoes  EXERCISE GUIDELINES FOR ADULTS: -if you wish to increase your physical activity, do so gradually and with the approval of your doctor -STOP and seek medical care immediately if you have any chest pain, chest discomfort or trouble breathing when starting or increasing exercise  -move and stretch your body, legs, feet and arms when sitting for long periods -Physical activity guidelines for optimal health in adults: -least 150 minutes per week of  aerobic exercise (can talk, but not sing) once approved by your doctor, 20-30 minutes of sustained activity or two 10 minute episodes of sustained activity every day.  -resistance training at least 2 days per week if approved by your doctor -balance exercises 3+ days per week:   Stand somewhere where you have something sturdy to hold onto if you lose balance.    1) lift up on toes, start with 5x per day and work up to 20x   2) stand and lift on leg straight out to the side so that foot is a few inches of the floor, start with 5x each side and work up to 20x each side   3) stand on one foot, start with 5 seconds each side and work up to 20 seconds on each side  If you need ideas or help with getting more active:  -Silver sneakers https://tools.silversneakers.com  -Walk with a Doc: http://stephens-thompson.biz/  -try to include resistance (weight lifting/strength building) and balance exercises twice per week: or the following link for ideas: ChessContest.fr  UpdateClothing.com.cy  STRESS MANAGEMENT: -can try meditating, or just sitting quietly with deep breathing while intentionally relaxing all parts of your body for 5 minutes daily -if you need further help with stress, anxiety or depression please follow up with your primary doctor or contact the wonderful folks at [place]: [place]: -options in [place] if you wish to engage in more social and exercise related activities:  -Silver sneakers https://tools.silversneakers.com  -Walk with a Doc: http://stephens-thompson.biz/  -Check out the [place] 50+ section on the [place] of [employer] (hiking clubs, book clubs, cards and games, chess, exercise classes, aquatic classes and much more) - see the website for  details: https://www.[place].gov/departments/parks-recreation/active-adults50  -YouTube has lots of exercise videos for different ages and abilities as well  -[place] (a variety of indoor and outdoor inperson activities for adults). 612-040-1930. [address].  -  Virtual Online Classes (a variety of topics): see seniorplanet.org or call [phone]  -consider volunteering at a school, hospice center, church, senior center or elsewhere   ADVANCED HEALTHCARE DIRECTIVES:  Everyone should have advanced health care directives in place. This is so that you get the care you want, should you ever be in a situation where you are unable to make your own medical decisions.   From the [place] Advanced Directive Website: "Advance Health Care Directives are legal documents in which you give written instructions about your health care if, in the future, you cannot speak for yourself.   A health care power of attorney allows you to name a person you trust to make your health care decisions if you cannot make them yourself. A declaration of a desire for a natural death (or living will) is document, which states that you desire not to have your life prolonged by extraordinary measures if you have a terminal or incurable illness or if you are in a vegetative state. An advance instruction for mental health treatment makes a declaration of instructions, information and preferences regarding your mental health treatment. It also states that you are aware that the advance instruction authorizes a mental health treatment provider to act according to your wishes. It may also outline your consent or refusal of mental health treatment. A declaration of an anatomical gift allows anyone over the age of 3 to make a gift by will, organ donor card or other document."   Please see the following website or an elder law attorney for forms, FAQs and for completion of advanced directives: [place]  TEFL teacher Health Care Directives Advance Health Care Directives (http://guzman.com/)  Or copy and paste the following to your web browser: PoshChat.fi          Terressa Koyanagi, DO

## 2023-08-16 ENCOUNTER — Telehealth: Payer: Self-pay | Admitting: Family Medicine

## 2023-08-16 MED ORDER — LISINOPRIL 10 MG PO TABS: 10.0000 mg | ORAL_TABLET | Freq: Every day | ORAL | 2 refills | Status: DC

## 2023-08-16 NOTE — Telephone Encounter (Signed)
Prescription Request  08/16/2023  LOV: 10/12/2022  What is the name of the medication or equipment? lisinopril (ZESTRIL) 10 MG tablet  Pt called to say she is leaving for [place] in just a few days and needs this refill, to take with.  Pt will be gone for a month, and only has a few pills left.   Have you contacted your pharmacy to request a refill? Yes   Which pharmacy would you like this sent to?   [id number] - [place], [place] - [address] AT CORNER OF [facility] [address] [place] [address] Phone: [phone] Fax: [phone]  Patient notified that their request is being sent to the clinical staff for review and that they should receive a response within 2 business days.   Please advise at Mobile [phone] (mobile)

## 2023-08-16 NOTE — Telephone Encounter (Signed)
Sent medication to the pharm patient is aware

## 2024-03-17 ENCOUNTER — Other Ambulatory Visit: Payer: Self-pay | Admitting: Family Medicine

## 2024-03-17 DIAGNOSIS — Z1231 Encounter for screening mammogram for malignant neoplasm of breast: Secondary | ICD-10-CM

## 2024-04-10 ENCOUNTER — Ambulatory Visit
Admission: RE | Admit: 2024-04-10 | Discharge: 2024-04-10 | Disposition: A | Discharge status: Home / Self Care | Source: Ambulatory Visit | Attending: Family Medicine | Admitting: Family Medicine

## 2024-04-10 DIAGNOSIS — Z1231 Encounter for screening mammogram for malignant neoplasm of breast: Secondary | ICD-10-CM

## 2024-05-07 ENCOUNTER — Other Ambulatory Visit: Payer: Self-pay | Admitting: Family Medicine

## 2024-06-02 ENCOUNTER — Other Ambulatory Visit: Payer: Self-pay | Admitting: Family Medicine

## 2024-06-06 ENCOUNTER — Other Ambulatory Visit: Payer: Self-pay | Admitting: Family Medicine

## 2024-06-17 ENCOUNTER — Encounter: Payer: Self-pay | Admitting: Family Medicine

## 2024-07-02 ENCOUNTER — Encounter: Payer: Self-pay | Admitting: Family Medicine

## 2024-07-02 ENCOUNTER — Ambulatory Visit

## 2024-07-02 ENCOUNTER — Ambulatory Visit (INDEPENDENT_AMBULATORY_CARE_PROVIDER_SITE_OTHER): Admitting: Family Medicine

## 2024-07-02 VITALS — Ht 68.0 in | Wt 153.0 lb

## 2024-07-02 VITALS — BP 118/64 | HR 75 | Temp 98.1°F | Ht 68.0 in | Wt 159.8 lb

## 2024-07-02 DIAGNOSIS — Z0000 Encounter for general adult medical examination without abnormal findings: Secondary | ICD-10-CM

## 2024-07-02 DIAGNOSIS — M810 Age-related osteoporosis without current pathological fracture: Secondary | ICD-10-CM | POA: Diagnosis not present

## 2024-07-02 DIAGNOSIS — I10 Essential (primary) hypertension: Secondary | ICD-10-CM | POA: Diagnosis not present

## 2024-07-02 DIAGNOSIS — E782 Mixed hyperlipidemia: Secondary | ICD-10-CM | POA: Diagnosis not present

## 2024-07-02 DIAGNOSIS — Z96643 Presence of artificial hip joint, bilateral: Secondary | ICD-10-CM

## 2024-07-02 LAB — CBC WITH DIFFERENTIAL/PLATELET
Basophils Absolute: 0 K/uL (ref 0.0–0.1)
Basophils Relative: 0.4 % (ref 0.0–3.0)
Eosinophils Absolute: 0 K/uL (ref 0.0–0.7)
Eosinophils Relative: 0.8 % (ref 0.0–5.0)
HCT: 43.2 % (ref 36.0–46.0)
Hemoglobin: 14.4 g/dL (ref 12.0–15.0)
Lymphocytes Relative: 35.5 % (ref 12.0–46.0)
Lymphs Abs: 2.1 K/uL (ref 0.7–4.0)
MCHC: 33.4 g/dL (ref 30.0–36.0)
MCV: 94.4 fl (ref 78.0–100.0)
Monocytes Absolute: 0.4 K/uL (ref 0.1–1.0)
Monocytes Relative: 7.3 % (ref 3.0–12.0)
Neutro Abs: 3.2 K/uL (ref 1.4–7.7)
Neutrophils Relative %: 56 % (ref 43.0–77.0)
Platelets: 298 K/uL (ref 150.0–400.0)
RBC: 4.57 Mil/uL (ref 3.87–5.11)
RDW: 12.5 % (ref 11.5–15.5)
WBC: 5.8 K/uL (ref 4.0–10.5)

## 2024-07-02 LAB — VITAMIN D 25 HYDROXY (VIT D DEFICIENCY, FRACTURES): VITD: 58.37 ng/mL (ref 30.00–100.00)

## 2024-07-02 LAB — TSH: TSH: 2.37 u[IU]/mL (ref 0.35–5.50)

## 2024-07-02 LAB — COMPREHENSIVE METABOLIC PANEL WITH GFR
ALT: 30 U/L (ref 0–35)
AST: 26 U/L (ref 0–37)
Albumin: 4.5 g/dL (ref 3.5–5.2)
Alkaline Phosphatase: 55 U/L (ref 39–117)
BUN: 16 mg/dL (ref 6–23)
CO2: 31 meq/L (ref 19–32)
Calcium: 9.6 mg/dL (ref 8.4–10.5)
Chloride: 100 meq/L (ref 96–112)
Creatinine, Ser: 0.96 mg/dL (ref 0.40–1.20)
GFR: 60.69 mL/min (ref >60.00)
Glucose, Bld: 113 mg/dL — ABNORMAL HIGH (ref 70–99)
Potassium: 4.5 meq/L (ref 3.5–5.1)
Sodium: 138 meq/L (ref 135–145)
Total Bilirubin: 0.6 mg/dL (ref 0.2–1.2)
Total Protein: 7.6 g/dL (ref 6.0–8.3)

## 2024-07-02 LAB — LIPID PANEL
Cholesterol: 293 mg/dL — ABNORMAL HIGH (ref 0–200)
HDL: 83.2 mg/dL (ref >39.00)
LDL Cholesterol: 181 mg/dL — ABNORMAL HIGH (ref 0–99)
NonHDL: 210.23
Total CHOL/HDL Ratio: 4
Triglycerides: 145 mg/dL (ref 0.0–149.0)
VLDL: 29 mg/dL (ref 0.0–40.0)

## 2024-07-02 MED ORDER — LISINOPRIL 10 MG PO TABS: 10.0000 mg | ORAL_TABLET | Freq: Every day | ORAL | 0 refills | Status: DC

## 2024-07-02 MED ORDER — AMOXICILLIN 500 MG PO CAPS: 500.0000 mg | ORAL_CAPSULE | ORAL | 0 refills | Status: AC

## 2024-07-02 NOTE — Progress Notes (Deleted)
 Subjective:   Sherry Harrison is a 67 y.o. who presents for a Medicare Wellness preventive visit.  As a reminder, Annual Wellness Visits don't include a physical exam, and some assessments may be limited, especially if this visit is performed virtually. We may recommend an in-person follow-up visit with your provider if needed.  Visit Complete: Virtual I connected with  Sherry Harrison on 07/02/24 by a audio enabled telemedicine application and verified that I am speaking with the correct person using two identifiers.  Patient Location: Home  Provider Location: Office/Clinic  I discussed the limitations of evaluation and management by telemedicine. The patient expressed understanding and agreed to proceed.  Vital Signs: Because this visit was a virtual/telehealth visit, some criteria may be missing or patient reported. Any vitals not documented were not able to be obtained and vitals that have been documented are patient reported.  VideoDeclined- This patient declined Librarian, academic. Therefore the visit was completed with audio only.  Persons Participating in Visit: Patient.  AWV Questionnaire: No: Patient Medicare AWV questionnaire was not completed prior to this visit.  Cardiac Risk Factors include: advanced age (>71men, >41 women);hypertension     Objective:    Today's Vitals   07/02/24 0858  Weight: 153 lb (69.4 kg)  Height: 5' 8 (1.727 m)   Body mass index is 23.26 kg/m.     07/02/2024    8:58 AM 08/27/2022    9:00 PM 08/27/2022    1:57 PM 05/24/2022   10:07 AM  Advanced Directives  Does Patient Have a Medical Advance Directive? No No No No  Would patient like information on creating a medical advance directive? Yes (MAU/Ambulatory/Procedural Areas - Information given) No - Patient declined No - Patient declined No - Patient declined    Current Medications (verified) Outpatient Encounter Medications as of 07/02/2024  Medication Sig    lisinopril  (ZESTRIL ) 10 MG tablet TAKE 1 TABLET BY MOUTH EVERY DAY   aspirin  81 MG chewable tablet Chew 1 tablet (81 mg total) by mouth 2 (two) times daily. (Patient not taking: Reported on 07/02/2024)   fluticasone  (FLONASE ) 50 MCG/ACT nasal spray Place 2 sprays into both nostrils daily. (Patient not taking: Reported on 07/02/2024)   No facility-administered encounter medications on file as of 07/02/2024.    Allergies (verified) Codeine and Xanax [alprazolam]   History: Past Medical History:  Diagnosis Date   Cancer (HCC)    Hypertension    Past Surgical History:  Procedure Laterality Date   HIP SURGERY     HIP SURGERY Left    TONSILLECTOMY     TOTAL HIP ARTHROPLASTY Right 08/28/2022   Procedure: RIGHT TOTAL HIP ARTHROPLASTY ANTERIOR APPROACH;  Surgeon: Sheril Coy, MD;  Location: [facility];  Service: Orthopedics;  Laterality: Right;   Family History  Problem Relation Age of Onset   Breast cancer Mother        pagets disease of nipple   Social History   Socioeconomic History   Marital status: Married    Spouse name: Not on file   Number of children: Not on file   Years of education: Not on file   Highest education level: Not on file  Occupational History   Not on file  Tobacco Use   Smoking status: Former    Current packs/day: 0.00    Types: Cigarettes    Quit date: 1980    Years since quitting: 45.6   Smokeless tobacco: Never  Substance and Sexual Activity  Alcohol use: Yes    Alcohol/week: 1.0 standard drink of alcohol    Types: 1 Glasses of wine per week    Comment: social   Drug use: Never   Sexual activity: Yes  Other Topics Concern   Not on file  Social History Narrative   Not on file   Social Drivers of Health   Financial Resource Strain: Low Risk  (07/02/2024)   Overall Financial Resource Strain (CARDIA)    Difficulty of Paying Living Expenses: Not hard at all  Food Insecurity: No Food Insecurity (07/02/2024)   Hunger Vital Sign    Worried  About Running Out of Food in the Last Year: Never true    Ran Out of Food in the Last Year: Never true  Transportation Needs: No Transportation Needs (07/02/2024)   PRAPARE - Administrator, Civil Service (Medical): No    Lack of Transportation (Non-Medical): No  Physical Activity: Sufficiently Active (07/02/2024)   Exercise Vital Sign    Days of Exercise per Week: 4 days    Minutes of Exercise per Session: 60 min  Stress: No Stress Concern Present (07/02/2024)   [employer] of Occupational Health - Occupational Stress Questionnaire    Feeling of Stress: Not at all  Social Connections: Moderately Integrated (07/02/2024)   Social Connection and Isolation Panel    Frequency of Communication with Friends and Family: More than three times a week    Frequency of Social Gatherings with Friends and Family: More than three times a week    Attends Religious Services: Never    Database administrator or Organizations: Yes    Attends Engineer, structural: More than 4 times per year    Marital Status: Married    Tobacco Counseling Counseling given: No    Clinical Intake:  Pre-visit preparation completed: Yes  Pain : No/denies pain     BMI - recorded: 23.26 Nutritional Status: BMI of 19-24  Normal Nutritional Risks: None Diabetes: No  No results found for: HGBA1C   How often do you need to have someone help you when you read instructions, pamphlets, or other written materials from your doctor or pharmacy?: 1 - Never  Interpreter Needed?: No  Information entered by :: Verdie Saba, CMA   Activities of Daily Living     07/02/2024    9:02 AM  In your present state of health, do you have any difficulty performing the following activities:  Hearing? 0  Vision? 0  Difficulty concentrating or making decisions? 0  Walking or climbing stairs? 0  Dressing or bathing? 0  Doing errands, shopping? 0  Preparing Food and eating ? N  Using the Toilet? N  In  the past six months, have you accidently leaked urine? N  Do you have problems with loss of bowel control? N  Managing your Medications? N  Managing your Finances? N  Housekeeping or managing your Housekeeping? N    Patient Care Team: Mercer Clotilda SAUNDERS, MD as PCP - General (Family Medicine)  I have updated your Care Teams any recent Medical Services you may have received from other providers in the past year.     Assessment:   This is a routine wellness examination for [place].  Hearing/Vision screen Hearing Screening - Comments:: Denies hearing difficulties   Vision Screening - Comments:: Wears rx glasses - up to date with routine eye exams with [facility]   Goals Addressed  This Visit's Progress     Patient Stated (pt-stated)        Patient stated she plans to continue exercising       Depression Screen     07/02/2024    9:03 AM 05/23/2023   12:59 PM 09/18/2022   10:49 AM 05/24/2022   10:02 AM 04/24/2021   10:31 AM  PHQ 2/9 Scores  PHQ - 2 Score 0 0 0 0 0  PHQ- 9 Score 0  1      Fall Risk     07/02/2024    9:02 AM 05/23/2023   12:57 PM 09/18/2022   10:49 AM 05/24/2022   10:06 AM 04/24/2021    9:34 AM  Fall Risk   Falls in the past year? 0 1 1 0 0  Number falls in past yr: 0 0 0 0   Injury with Fall? 0 1 1 0   Risk for fall due to : No Fall Risks History of fall(s) Orthopedic patient No Fall Risks   Follow up Falls evaluation completed;Falls prevention discussed  Falls evaluation completed        Data saved with a previous flowsheet row definition    MEDICARE RISK AT HOME:  Medicare Risk at Home Any stairs in or around the home?: No If so, are there any without handrails?: No Home free of loose throw rugs in walkways, pet beds, electrical cords, etc?: Yes Adequate lighting in your home to reduce risk of falls?: Yes Life alert?: No Use of a cane, walker or w/c?: No Grab bars in the bathroom?: No Shower chair or bench in shower?:  Yes Elevated toilet seat or a handicapped toilet?: No  TIMED UP AND GO:  Was the test performed?  No  Cognitive Function: 6CIT completed        07/02/2024    9:05 AM 05/24/2022   10:07 AM  6CIT Screen  What Year? 0 points 0 points  What month? 0 points 0 points  What time? 0 points 0 points  Count back from 20 0 points 0 points  Months in reverse 0 points 0 points  Repeat phrase 0 points 0 points  Total Score 0 points 0 points    Immunizations Immunization History  Administered Date(s) Administered   Fluad Quad(high Dose 65+) 09/14/2021, 10/12/2022   Influenza Inj Mdck Quad Pf 09/19/2016   Influenza, High Dose Seasonal PF 09/09/2023   Influenza,inj,Quad PF,6+ Mos 09/18/2018, 08/25/2019   Influenza,inj,quad, With Preservative 08/20/2017   Influenza-Unspecified 09/03/2015, 09/09/2023   PFIZER Comirnaty(Gray Top)Covid-19 Tri-Sucrose Vaccine 03/14/2021   PFIZER(Purple Top)SARS-COV-2 Vaccination 02/15/2020, 02/28/2020, 03/17/2020, 03/20/2020, 10/03/2020, 10/10/2020, 12/14/2020, 03/03/2021   PNEUMOCOCCAL CONJUGATE-20 04/24/2021   Tdap 11/02/2014   Zoster Recombinant(Shingrix ) 09/09/2019, 02/12/2020   Zoster, Live 02/01/2016    Screening Tests Health Maintenance  Topic Date Due   COVID-19 Vaccine (10 - 2024-25 season) 07/18/2024 (Originally 08/04/2023)   INFLUENZA VACCINE  07/03/2024   DTaP/Tdap/Td (2 - Td or Tdap) 11/02/2024   Medicare Annual Wellness (AWV)  07/02/2025   Colonoscopy  03/23/2026   MAMMOGRAM  04/10/2026   Pneumococcal Vaccine: 50+ Years  Completed   DEXA SCAN  Completed   Hepatitis C Screening  Completed   Zoster Vaccines- Shingrix   Completed   Hepatitis B Vaccines  Aged Out   HPV VACCINES  Aged Out   Meningococcal B Vaccine  Aged Out    Health Maintenance  There are no preventive care reminders to display for this patient.  Health Maintenance Items Addressed: 07/02/2024  Additional Screening:  Vision Screening: Recommended annual ophthalmology  exams for early detection of glaucoma and other disorders of the eye. Would you like a referral to an eye doctor? No  Patient stated she has eye exams w/[facility].  Dental Screening: Recommended annual dental exams for proper oral hygiene  Community Resource Referral / Chronic Care Management: CRR required this visit?  No   CCM required this visit?  No   Plan:    I have personally reviewed and noted the following in the patient's chart:   Medical and social history Use of alcohol, tobacco or illicit drugs  Current medications and supplements including opioid prescriptions. Patient is not currently taking opioid prescriptions. Functional ability and status Nutritional status Physical activity Advanced directives List of other physicians Hospitalizations, surgeries, and ER visits in previous 12 months Vitals Screenings to include cognitive, depression, and falls Referrals and appointments  In addition, I have reviewed and discussed with patient certain preventive protocols, quality metrics, and best practice recommendations. A written personalized care plan for preventive services as well as general preventive health recommendations were provided to patient.   Verdie CHRISTELLA Saba, CMA   07/02/2024   After Visit Summary: (MyChart) Due to this being a telephonic visit, the after visit summary with patients personalized plan was offered to patient via MyChart   Notes: Nothing significant to report at this time.

## 2024-07-02 NOTE — Patient Instructions (Signed)
 Ms. Sherry Harrison , Thank you for taking time out of your busy schedule to complete your Annual Wellness Visit with me. I enjoyed our conversation and look forward to speaking with you again next year. I, as well as your care team,  appreciate your ongoing commitment to your health goals. Please review the following plan we discussed and let me know if I can assist you in the future. Your Game plan/ To Do List    Follow up Visits: Next Medicare AWV with our clinical staff: 07/07/2025 in the office   Have you seen your provider in the last 6 months (3 months if uncontrolled diabetes)? No Next Office Visit with your provider: 07/02/2024  Clinician Recommendations:  Aim for 30 minutes of exercise or brisk walking, 6-8 glasses of water, and 5 servings of fruits and vegetables each day. Educated and advised on getting the Tdap vaccine in 2025.      This is a list of the screening recommended for you and due dates:  Health Maintenance  Topic Date Due   COVID-19 Vaccine (10 - 2024-25 season) 07/18/2024*   Flu Shot  07/03/2024   DTaP/Tdap/Td vaccine (2 - Td or Tdap) 11/02/2024   Medicare Annual Wellness Visit  07/02/2025   Colon Cancer Screening  03/23/2026   Mammogram  04/10/2026   Pneumococcal Vaccine for age over 2  Completed   DEXA scan (bone density measurement)  Completed   Hepatitis C Screening  Completed   Zoster (Shingles) Vaccine  Completed   Hepatitis B Vaccine  Aged Out   HPV Vaccine  Aged Out   Meningitis B Vaccine  Aged Out  *Topic was postponed. The date shown is not the original due date.    Advanced directives: (Copy Requested) Please bring a copy of your health care power of attorney and living will to the office to be added to your chart at your convenience. You can mail to [facility] [address] [place], [place] [address] or email to [email or web address] Advance Care Planning is important because it:  [x]  Makes sure you receive the medical care that  is consistent with your values, goals, and preferences  [x]  It provides guidance to your family and loved ones and reduces their decisional burden about whether or not they are making the right decisions based on your wishes.  Follow the link provided in your after visit summary or read over the paperwork we have mailed to you to help you started getting your Advance Directives in place. If you need assistance in completing these, please reach out to us  so that we can help you!

## 2024-07-02 NOTE — Progress Notes (Signed)
 Established Patient Office Visit   Subjective  Patient ID: Sherry Harrison, female    DOB: Nov 14, 1956  Age: 68 y.o. MRN: [id number]  Chief Complaint  Patient presents with   Annual Exam    Pt is a 68 yo female seen for CPE.  Pt had AWV earlier today.  Pt doing well overall.  Requesting rx for ppx amox for upcoming dental cleaning.  Has h/o b/l THR.  Pt states bp is controlled.  Requesting 90 d refills on lisinopril .  Recently given 30 d supply until appt.    Patient Active Problem List   Diagnosis Date Noted   Closed displaced fracture of right femoral neck (HCC) 08/27/2022   Hyponatremia 08/27/2022   Essential hypertension 04/24/2021   Arthritis of left hip 03/23/2021   Spinal stenosis, lumbar region, with neurogenic claudication 03/23/2021   Low back pain 12/23/2020   Osteoporosis 05/21/2016   Past Medical History:  Diagnosis Date   Cancer (HCC)    Hypertension    Past Surgical History:  Procedure Laterality Date   HIP SURGERY     HIP SURGERY Left    TONSILLECTOMY     TOTAL HIP ARTHROPLASTY Right 08/28/2022   Procedure: RIGHT TOTAL HIP ARTHROPLASTY ANTERIOR APPROACH;  Surgeon: Sheril Coy, MD;  Location: [facility];  Service: Orthopedics;  Laterality: Right;   Social History   Tobacco Use   Smoking status: Former    Current packs/day: 0.00    Types: Cigarettes    Quit date: 1980    Years since quitting: 45.6   Smokeless tobacco: Never  Substance Use Topics   Alcohol use: Yes    Alcohol/week: 1.0 standard drink of alcohol    Types: 1 Glasses of wine per week    Comment: social   Drug use: Never   Family History  Problem Relation Age of Onset   Breast cancer Mother        pagets disease of nipple   Allergies  Allergen Reactions   Codeine Nausea Only   Xanax [Alprazolam] Other (See Comments)    Causes hyperness    ROS Negative unless stated above    Objective:     BP 118/64 (BP Location: Left Arm, Patient Position: Sitting, Cuff Size: Normal)    Pulse 75   Temp 98.1 F (36.7 C) (Oral)   Ht 5' 8 (1.727 m)   Wt 159 lb 12.8 oz (72.5 kg)   SpO2 98%   BMI 24.30 kg/m  BP Readings from Last 3 Encounters:  07/02/24 118/64  10/12/22 128/70  09/18/22 108/64   Wt Readings from Last 3 Encounters:  07/02/24 159 lb 12.8 oz (72.5 kg)  07/02/24 153 lb (69.4 kg)  10/12/22 153 lb 6.4 oz (69.6 kg)      Physical Exam Constitutional:      Appearance: Normal appearance.  HENT:     Head: Normocephalic and atraumatic.     Right Ear: Tympanic membrane, ear canal and external ear normal.     Left Ear: Tympanic membrane, ear canal and external ear normal.     Nose: Nose normal.     Mouth/Throat:     Mouth: Mucous membranes are moist.     Pharynx: No oropharyngeal exudate or posterior oropharyngeal erythema.  Eyes:     General: No scleral icterus.    Extraocular Movements: Extraocular movements intact.     Conjunctiva/sclera: Conjunctivae normal.     Pupils: Pupils are equal, round, and reactive to light.  Neck:  Thyroid : No thyromegaly.  Cardiovascular:     Rate and Rhythm: Normal rate and regular rhythm.     Pulses: Normal pulses.     Heart sounds: Normal heart sounds. No murmur heard.    No friction rub.  Pulmonary:     Effort: Pulmonary effort is normal.     Breath sounds: Normal breath sounds. No wheezing, rhonchi or rales.  Abdominal:     General: Bowel sounds are normal.     Palpations: Abdomen is soft.     Tenderness: There is no abdominal tenderness.  Musculoskeletal:        General: No deformity. Normal range of motion.  Lymphadenopathy:     Cervical: No cervical adenopathy.  Skin:    General: Skin is warm and dry.     Findings: No lesion.  Neurological:     General: No focal deficit present.     Mental Status: She is alert and oriented to person, place, and time.  Psychiatric:        Mood and Affect: Mood normal.        Thought Content: Thought content normal.        07/02/2024    9:03 AM 05/23/2023    12:59 PM 09/18/2022   10:49 AM  Depression screen PHQ 2/9  Decreased Interest 0 0 0  Down, Depressed, Hopeless 0 0 0  PHQ - 2 Score 0 0 0  Altered sleeping 0  0  Tired, decreased energy 0  1  Change in appetite 0  0  Feeling bad or failure about yourself  0  0  Trouble concentrating 0  0  Moving slowly or fidgety/restless 0  0  Suicidal thoughts 0  0  PHQ-9 Score 0  1  Difficult doing work/chores Not difficult at all  Not difficult at all      07/02/2024   10:54 AM  GAD 7 : Generalized Anxiety Score  Nervous, Anxious, on Edge 0  Control/stop worrying 0  Worry too much - different things 0  Trouble relaxing 0  Restless 0  Easily annoyed or irritable 0  Afraid - awful might happen 0  Total GAD 7 Score 0  Anxiety Difficulty Not difficult at all     No results found for any visits on 07/02/24.    Assessment & Plan:   Well adult exam  Essential hypertension -     CBC with Differential/Platelet; Future -     TSH; Future -     Lipid panel; Future -     Comprehensive metabolic panel with GFR -     Lisinopril ; Take 1 tablet (10 mg total) by mouth daily.  Dispense: 90 tablet; Refill: 3  Osteoporosis, unspecified osteoporosis type, unspecified pathological fracture presence -     VITAMIN D  25 Hydroxy (Vit-D Deficiency, Fractures); Future  History of bilateral hip arthroplasty -     Amoxicillin ; Take 1 capsule (500 mg total) by mouth as directed. Take 4 pills one hour prior to Dental Visit  Dispense: 4 capsule; Refill: 0  Mixed hyperlipidemia -     Lipid panel; Future -     Comprehensive metabolic panel with GFR  Age appropriate health screenings discussed.  Will obtain labs.  Immunizations reviewed and up to date.  Consider influenza vaccine when available in a few wks.  Mammogram done 04/10/24.  Colonoscopy done 03/23/16.  Repeat due in 10 yrs, 2027.  Last pap 2017.  No longer indicated 2/2 age.  Bone density scan recommended q  2 yrs.  Pt will let us  know if wishes to have  done.  HTN well controlled.  Continue lisinopril  10 mg daily.   Return in about 1 year (around 07/02/2025) for physical.  Sooner if needed.   Clotilda JONELLE Single, MD

## 2024-07-06 NOTE — Progress Notes (Signed)
 Subjective:   Sherry Harrison is a 68 y.o. who presents for a Medicare Wellness preventive visit.  As a reminder, Annual Wellness Visits don't include a physical exam, and some assessments may be limited, especially if this visit is performed virtually. We may recommend an in-person follow-up visit with your provider if needed.  Visit Complete: Virtual I connected with  Sherry Harrison on 07/02/2024 by a audio enabled telemedicine application and verified that I am speaking with the correct person using two identifiers.  Patient Location: Home  Provider Location: Office/Clinic  I discussed the limitations of evaluation and management by telemedicine. The patient expressed understanding and agreed to proceed.  Vital Signs: Because this visit was a virtual/telehealth visit, some criteria may be missing or patient reported. Any vitals not documented were not able to be obtained and vitals that have been documented are patient reported.  VideoDeclined- This patient declined Librarian, academic. Therefore the visit was completed with audio only.  Persons Participating in Visit: Patient.  AWV Questionnaire: No: Patient Medicare AWV questionnaire was not completed prior to this visit.  Cardiac Risk Factors include: advanced age (>38men, >27 women);hypertension     Objective:    Today's Vitals   07/02/24 0858  Weight: 153 lb (69.4 kg)  Height: 5' 8 (1.727 m)   Body mass index is 23.26 kg/m.     07/02/2024    8:58 AM 08/27/2022    9:00 PM 08/27/2022    1:57 PM 05/24/2022   10:07 AM  Advanced Directives  Does Patient Have a Medical Advance Directive? No No No No  Would patient like information on creating a medical advance directive? Yes (MAU/Ambulatory/Procedural Areas - Information given) No - Patient declined No - Patient declined No - Patient declined    Current Medications (verified) Outpatient Encounter Medications as of 07/02/2024  Medication Sig    [DISCONTINUED] lisinopril  (ZESTRIL ) 10 MG tablet TAKE 1 TABLET BY MOUTH EVERY DAY   [DISCONTINUED] aspirin  81 MG chewable tablet Chew 1 tablet (81 mg total) by mouth 2 (two) times daily. (Patient not taking: Reported on 07/02/2024)   [DISCONTINUED] fluticasone  (FLONASE ) 50 MCG/ACT nasal spray Place 2 sprays into both nostrils daily. (Patient not taking: Reported on 07/02/2024)   No facility-administered encounter medications on file as of 07/02/2024.    Allergies (verified) Codeine and Xanax [alprazolam]   History: Past Medical History:  Diagnosis Date   Cancer (HCC)    Hypertension    Past Surgical History:  Procedure Laterality Date   HIP SURGERY     HIP SURGERY Left    TONSILLECTOMY     TOTAL HIP ARTHROPLASTY Right 08/28/2022   Procedure: RIGHT TOTAL HIP ARTHROPLASTY ANTERIOR APPROACH;  Surgeon: Sheril Coy, MD;  Location: [facility];  Service: Orthopedics;  Laterality: Right;   Family History  Problem Relation Age of Onset   Breast cancer Mother        pagets disease of nipple   Social History   Socioeconomic History   Marital status: Married    Spouse name: Not on file   Number of children: Not on file   Years of education: Not on file   Highest education level: Not on file  Occupational History   Not on file  Tobacco Use   Smoking status: Former    Current packs/day: 0.00    Types: Cigarettes    Quit date: 1980    Years since quitting: 45.6   Smokeless tobacco: Never  Substance and Sexual  Activity   Alcohol use: Yes    Alcohol/week: 1.0 standard drink of alcohol    Types: 1 Glasses of wine per week    Comment: social   Drug use: Never   Sexual activity: Yes  Other Topics Concern   Not on file  Social History Narrative   Not on file   Social Drivers of Health   Financial Resource Strain: Low Risk  (07/02/2024)   Overall Financial Resource Strain (CARDIA)    Difficulty of Paying Living Expenses: Not hard at all  Food Insecurity: No Food Insecurity  (07/02/2024)   Hunger Vital Sign    Worried About Running Out of Food in the Last Year: Never true    Ran Out of Food in the Last Year: Never true  Transportation Needs: No Transportation Needs (07/02/2024)   PRAPARE - Administrator, Civil Service (Medical): No    Lack of Transportation (Non-Medical): No  Physical Activity: Sufficiently Active (07/02/2024)   Exercise Vital Sign    Days of Exercise per Week: 4 days    Minutes of Exercise per Session: 60 min  Stress: No Stress Concern Present (07/02/2024)   [employer] of Occupational Health - Occupational Stress Questionnaire    Feeling of Stress: Not at all  Social Connections: Moderately Integrated (07/02/2024)   Social Connection and Isolation Panel    Frequency of Communication with Friends and Family: More than three times a week    Frequency of Social Gatherings with Friends and Family: More than three times a week    Attends Religious Services: Never    Database administrator or Organizations: Yes    Attends Engineer, structural: More than 4 times per year    Marital Status: Married    Tobacco Counseling Counseling given: No    Clinical Intake:  Pre-visit preparation completed: Yes  Pain : No/denies pain     BMI - recorded: 23.26 Nutritional Status: BMI of 19-24  Normal Nutritional Risks: None Diabetes: No  No results found for: HGBA1C   How often do you need to have someone help you when you read instructions, pamphlets, or other written materials from your doctor or pharmacy?: 1 - Never  Interpreter Needed?: No  Information entered by :: Verdie Saba, CMA   Activities of Daily Living     07/02/2024    9:02 AM  In your present state of health, do you have any difficulty performing the following activities:  Hearing? 0  Vision? 0  Difficulty concentrating or making decisions? 0  Walking or climbing stairs? 0  Dressing or bathing? 0  Doing errands, shopping? 0  Preparing Food  and eating ? N  Using the Toilet? N  In the past six months, have you accidently leaked urine? N  Do you have problems with loss of bowel control? N  Managing your Medications? N  Managing your Finances? N  Housekeeping or managing your Housekeeping? N    Patient Care Team: Mercer Clotilda SAUNDERS, MD as PCP - General (Family Medicine)  I have updated your Care Teams any recent Medical Services you may have received from other providers in the past year.     Assessment:   This is a routine wellness examination for [place].  Hearing/Vision screen Hearing Screening - Comments:: Denies hearing difficulties   Vision Screening - Comments:: Wears rx glasses - up to date with routine eye exams with [facility]   Goals Addressed  This Visit's Progress     Patient Stated (pt-stated)        Patient stated she plans to continue exercising       Depression Screen     07/02/2024   10:54 AM 07/02/2024    9:03 AM 05/23/2023   12:59 PM 09/18/2022   10:49 AM 05/24/2022   10:02 AM 04/24/2021   10:31 AM  PHQ 2/9 Scores  PHQ - 2 Score 0 0 0 0 0 0  PHQ- 9 Score 1 0  1      Fall Risk     07/02/2024   10:54 AM 07/02/2024    9:02 AM 05/23/2023   12:57 PM 09/18/2022   10:49 AM 05/24/2022   10:06 AM  Fall Risk   Falls in the past year? 0 0 1 1 0  Number falls in past yr: 0 0 0 0 0  Injury with Fall? 0 0 1 1 0  Risk for fall due to : No Fall Risks No Fall Risks History of fall(s) Orthopedic patient No Fall Risks  Follow up Falls evaluation completed Falls evaluation completed;Falls prevention discussed  Falls evaluation completed       Data saved with a previous flowsheet row definition    MEDICARE RISK AT HOME:  Medicare Risk at Home Any stairs in or around the home?: No If so, are there any without handrails?: No Home free of loose throw rugs in walkways, pet beds, electrical cords, etc?: Yes Adequate lighting in your home to reduce risk of falls?: Yes Life alert?:  No Use of a cane, walker or w/c?: No Grab bars in the bathroom?: No Shower chair or bench in shower?: Yes Elevated toilet seat or a handicapped toilet?: No  TIMED UP AND GO:  Was the test performed?  No  Cognitive Function: 6CIT completed        07/02/2024    9:05 AM 05/24/2022   10:07 AM  6CIT Screen  What Year? 0 points 0 points  What month? 0 points 0 points  What time? 0 points 0 points  Count back from 20 0 points 0 points  Months in reverse 0 points 0 points  Repeat phrase 0 points 0 points  Total Score 0 points 0 points    Immunizations Immunization History  Administered Date(s) Administered   Fluad Quad(high Dose 65+) 09/14/2021, 10/12/2022   Influenza Inj Mdck Quad Pf 09/19/2016   Influenza, High Dose Seasonal PF 09/09/2023   Influenza,inj,Quad PF,6+ Mos 09/18/2018, 08/25/2019   Influenza,inj,quad, With Preservative 08/20/2017   Influenza-Unspecified 09/03/2015, 09/09/2023   PFIZER Comirnaty(Gray Top)Covid-19 Tri-Sucrose Vaccine 03/14/2021   PFIZER(Purple Top)SARS-COV-2 Vaccination 02/15/2020, 02/28/2020, 03/17/2020, 03/20/2020, 10/03/2020, 10/10/2020, 12/14/2020, 03/03/2021   PNEUMOCOCCAL CONJUGATE-20 04/24/2021   Tdap 11/02/2014   Zoster Recombinant(Shingrix ) 09/09/2019, 02/12/2020   Zoster, Live 02/01/2016    Screening Tests Health Maintenance  Topic Date Due   INFLUENZA VACCINE  07/03/2024   COVID-19 Vaccine (10 - 2024-25 season) 07/18/2024 (Originally 08/04/2023)   DTaP/Tdap/Td (2 - Td or Tdap) 11/02/2024   Medicare Annual Wellness (AWV)  07/02/2025   Colonoscopy  03/23/2026   MAMMOGRAM  04/10/2026   Pneumococcal Vaccine: 50+ Years  Completed   DEXA SCAN  Completed   Hepatitis C Screening  Completed   Zoster Vaccines- Shingrix   Completed   Hepatitis B Vaccines  Aged Out   HPV VACCINES  Aged Out   Meningococcal B Vaccine  Aged Out    Health Maintenance  Health Maintenance Due  Topic Date Due  INFLUENZA VACCINE  07/03/2024    Health  Maintenance Items Addressed: 07/02/2024  Additional Screening:  Vision Screening: Recommended annual ophthalmology exams for early detection of glaucoma and other disorders of the eye. Would you like a referral to an eye doctor? No  Patient stated she has eye exams w/[facility].  Dental Screening: Recommended annual dental exams for proper oral hygiene  Community Resource Referral / Chronic Care Management: CRR required this visit?  No   CCM required this visit?  No   Plan:    I have personally reviewed and noted the following in the patient's chart:   Medical and social history Use of alcohol, tobacco or illicit drugs  Current medications and supplements including opioid prescriptions. Patient is not currently taking opioid prescriptions. Functional ability and status Nutritional status Physical activity Advanced directives List of other physicians Hospitalizations, surgeries, and ER visits in previous 12 months Vitals Screenings to include cognitive, depression, and falls Referrals and appointments  In addition, I have reviewed and discussed with patient certain preventive protocols, quality metrics, and best practice recommendations. A written personalized care plan for preventive services as well as general preventive health recommendations were provided to patient.   Verdie CHRISTELLA Saba, CMA    07/02/2024   After Visit Summary: (MyChart) Due to this being a telephonic visit, the after visit summary with patients personalized plan was offered to patient via MyChart   Notes: Nothing significant to report at this time.

## 2024-07-07 ENCOUNTER — Ambulatory Visit: Payer: Self-pay | Admitting: Family Medicine

## 2024-07-10 ENCOUNTER — Encounter: Payer: Self-pay | Admitting: Family Medicine

## 2024-07-10 MED ORDER — LISINOPRIL 10 MG PO TABS: 10.0000 mg | ORAL_TABLET | Freq: Every day | ORAL | 3 refills | Status: AC

## 2024-07-15 ENCOUNTER — Other Ambulatory Visit: Payer: Self-pay | Admitting: Family Medicine

## 2024-07-15 DIAGNOSIS — E782 Mixed hyperlipidemia: Secondary | ICD-10-CM

## 2024-07-15 MED ORDER — ROSUVASTATIN CALCIUM 10 MG PO TABS: 10.0000 mg | ORAL_TABLET | Freq: Every day | ORAL | 3 refills | Status: AC

## 2025-07-07 ENCOUNTER — Ambulatory Visit
